# Patient Record
Sex: Female | Born: 1970 | Race: Black or African American | Hispanic: No | Marital: Married | Smoking: Former smoker
Health system: Southern US, Community
[De-identification: ages and names within clinical notes are randomized; demographics above are authoritative.]

## PROBLEM LIST (undated history)

## (undated) DIAGNOSIS — N809 Endometriosis, unspecified: Secondary | ICD-10-CM

## (undated) DIAGNOSIS — D649 Anemia, unspecified: Secondary | ICD-10-CM

## (undated) DIAGNOSIS — I10 Essential (primary) hypertension: Secondary | ICD-10-CM

## (undated) DIAGNOSIS — E559 Vitamin D deficiency, unspecified: Secondary | ICD-10-CM

## (undated) DIAGNOSIS — I5189 Other ill-defined heart diseases: Secondary | ICD-10-CM

## (undated) HISTORY — PX: CHOLECYSTECTOMY: SHX55

## (undated) HISTORY — PX: TUBAL LIGATION: SHX77

---

## 2010-12-19 ENCOUNTER — Emergency Department (HOSPITAL_COMMUNITY)
Admission: EM | Admit: 2010-12-19 | Discharge: 2010-12-19 | Disposition: A | Discharge status: Home / Self Care | Payer: BC Managed Care – HMO | Attending: Emergency Medicine | Admitting: Emergency Medicine

## 2010-12-19 DIAGNOSIS — N63 Unspecified lump in unspecified breast: Secondary | ICD-10-CM | POA: Insufficient documentation

## 2010-12-19 DIAGNOSIS — Z8742 Personal history of other diseases of the female genital tract: Secondary | ICD-10-CM | POA: Insufficient documentation

## 2010-12-19 DIAGNOSIS — N644 Mastodynia: Secondary | ICD-10-CM | POA: Insufficient documentation

## 2013-01-05 ENCOUNTER — Emergency Department (HOSPITAL_BASED_OUTPATIENT_CLINIC_OR_DEPARTMENT_OTHER): Payer: BC Managed Care – HMO

## 2013-01-05 ENCOUNTER — Emergency Department (HOSPITAL_BASED_OUTPATIENT_CLINIC_OR_DEPARTMENT_OTHER)
Admission: EM | Admit: 2013-01-05 | Discharge: 2013-01-05 | Disposition: A | Discharge status: Home / Self Care | Payer: BC Managed Care – HMO | Attending: Emergency Medicine | Admitting: Emergency Medicine

## 2013-01-05 ENCOUNTER — Encounter (HOSPITAL_BASED_OUTPATIENT_CLINIC_OR_DEPARTMENT_OTHER): Payer: Self-pay | Admitting: *Deleted

## 2013-01-05 DIAGNOSIS — Z8639 Personal history of other endocrine, nutritional and metabolic disease: Secondary | ICD-10-CM | POA: Insufficient documentation

## 2013-01-05 DIAGNOSIS — N390 Urinary tract infection, site not specified: Secondary | ICD-10-CM | POA: Insufficient documentation

## 2013-01-05 DIAGNOSIS — R209 Unspecified disturbances of skin sensation: Secondary | ICD-10-CM | POA: Insufficient documentation

## 2013-01-05 DIAGNOSIS — Z862 Personal history of diseases of the blood and blood-forming organs and certain disorders involving the immune mechanism: Secondary | ICD-10-CM | POA: Insufficient documentation

## 2013-01-05 DIAGNOSIS — R079 Chest pain, unspecified: Secondary | ICD-10-CM | POA: Insufficient documentation

## 2013-01-05 DIAGNOSIS — Z79899 Other long term (current) drug therapy: Secondary | ICD-10-CM | POA: Insufficient documentation

## 2013-01-05 DIAGNOSIS — R300 Dysuria: Secondary | ICD-10-CM | POA: Insufficient documentation

## 2013-01-05 DIAGNOSIS — I10 Essential (primary) hypertension: Secondary | ICD-10-CM | POA: Insufficient documentation

## 2013-01-05 DIAGNOSIS — R109 Unspecified abdominal pain: Secondary | ICD-10-CM | POA: Insufficient documentation

## 2013-01-05 DIAGNOSIS — M549 Dorsalgia, unspecified: Secondary | ICD-10-CM | POA: Insufficient documentation

## 2013-01-05 HISTORY — DX: Vitamin D deficiency, unspecified: E55.9

## 2013-01-05 HISTORY — DX: Essential (primary) hypertension: I10

## 2013-01-05 HISTORY — DX: Anemia, unspecified: D64.9

## 2013-01-05 LAB — CBC WITH DIFFERENTIAL/PLATELET
Basophils Relative: 0 % (ref 0–1)
Eosinophils Relative: 2 % (ref 0–5)
Hemoglobin: 9.4 g/dL — ABNORMAL LOW (ref 12.0–15.0)
Lymphocytes Relative: 30 % (ref 12–46)
MCH: 21.9 pg — ABNORMAL LOW (ref 26.0–34.0)
Monocytes Absolute: 0.6 10*3/uL (ref 0.1–1.0)
Monocytes Relative: 9 % (ref 3–12)
Neutrophils Relative %: 59 % (ref 43–77)
RBC: 4.3 MIL/uL (ref 3.87–5.11)
WBC: 6.8 10*3/uL (ref 4.0–10.5)

## 2013-01-05 LAB — COMPREHENSIVE METABOLIC PANEL
ALT: 15 U/L (ref 0–35)
AST: 17 U/L (ref 0–37)
Alkaline Phosphatase: 70 U/L (ref 39–117)
CO2: 26 mEq/L (ref 19–32)
Chloride: 101 mEq/L (ref 96–112)
GFR calc Af Amer: >90 mL/min (ref >90)
GFR calc non Af Amer: 89 mL/min — ABNORMAL LOW (ref >90)
Glucose, Bld: 109 mg/dL — ABNORMAL HIGH (ref 70–99)
Potassium: 3.6 mEq/L (ref 3.5–5.1)
Sodium: 137 mEq/L (ref 135–145)
Total Bilirubin: 0.2 mg/dL — ABNORMAL LOW (ref 0.3–1.2)

## 2013-01-05 LAB — URINE MICROSCOPIC-ADD ON

## 2013-01-05 LAB — URINALYSIS, ROUTINE W REFLEX MICROSCOPIC
Bilirubin Urine: NEGATIVE
Glucose, UA: NEGATIVE mg/dL
Hgb urine dipstick: NEGATIVE
Protein, ur: NEGATIVE mg/dL
Urobilinogen, UA: 1 mg/dL (ref 0.0–1.0)

## 2013-01-05 MED ORDER — CEPHALEXIN 500 MG PO CAPS: 500.0000 mg | ORAL_CAPSULE | Freq: Four times a day (QID) | ORAL | Status: DC

## 2013-01-05 MED ORDER — ASPIRIN 81 MG PO CHEW
324.0000 mg | CHEWABLE_TABLET | Freq: Once | ORAL | Status: AC
  Administered 2013-01-05: 324 mg via ORAL
  Filled 2013-01-05: qty 4

## 2013-01-05 MED ORDER — NITROGLYCERIN 0.4 MG SL SUBL
0.4000 mg | SUBLINGUAL_TABLET | SUBLINGUAL | Status: DC | PRN
  Filled 2013-01-05: qty 25

## 2013-01-05 NOTE — ED Provider Notes (Addendum)
Medical screening examination/treatment/procedure(s) were conducted as a shared visit with non-physician practitioner(s) and myself.  I personally evaluated the patient during the encounter  L sided chest pressure at rest with tingling in L arm. No nausea, some SOB. No previous cardiac history. Pain free now. EKG nsr.  No hypoxia or tachycardia to suggest PE.  Hx HTN, nonsmoker, no family history CAD. Heart score 2. Recommended observation for stress test which patient declined. She is agreeable to serial troponins.  Glynn Octave, MD 01/05/13 2009  Glynn Octave, MD 01/05/13 2026

## 2013-01-05 NOTE — ED Provider Notes (Signed)
   History    CSN: [id number] Arrival date & time 01/05/13  1413  First MD Initiated Contact with Patient 01/05/13 1433     Chief Complaint  Patient presents with  . Chest Pain   (Consider location/radiation/quality/duration/timing/severity/associated sxs/prior Treatment) Patient is a 42 y.o. female presenting with chest pain. The history is provided by the patient. No language interpreter was used.  Chest Pain Pain location:  L chest Pain quality: aching   Pain radiates to:  Upper back Pain radiates to the back: yes   Pain severity:  Moderate Onset quality:  Gradual Timing:  Constant Progression:  Worsening Chronicity:  New Context: breathing   Worsened by:  Movement Associated symptoms: abdominal pain   Associated symptoms: no cough   Pt reports she had chest pain before coming in pain lasted approx 30-45 minutes, no sweating, no nausea,   Pt had some tingling in left arm.   Pt also complains of back pain and feels like she has a urinary tract infection Past Medical History  Diagnosis Date  . Hypertension   . Anemia   . Vitamin D deficiency    Past Surgical History  Procedure Laterality Date  . Cholecystectomy    . Cesarean section     No family history on file. History  Substance Use Topics  . Smoking status: Never Smoker   . Smokeless tobacco: Not on file  . Alcohol Use: Yes   OB History   Grav Para Term Preterm Abortions TAB SAB Ect Mult Living                 Review of Systems  Respiratory: Negative for cough.   Cardiovascular: Positive for chest pain.  Gastrointestinal: Positive for abdominal pain.  Genitourinary: Positive for dysuria.  All other systems reviewed and are negative.    Allergies  Doxycycline  Home Medications   Current Outpatient Rx  Name  Route  Sig  Dispense  Refill  . amLODipine-benazepril (LOTREL) 5-10 MG per capsule   Oral   Take 1 capsule by mouth daily.          BP 135/76  Pulse 87  Temp(Src) 98.9 F (37.2 C)  (Oral)  Resp 20  Wt 210 lb (95.255 kg)  SpO2 99% Physical Exam  Constitutional: She appears well-developed and well-nourished.  HENT:  Head: Normocephalic.  Right Ear: External ear normal.  Left Ear: External ear normal.  Eyes: Conjunctivae are normal. Pupils are equal, round, and reactive to light.  Neck: Normal range of motion. Neck supple.  Cardiovascular: Normal rate.   Abdominal: Soft. Bowel sounds are normal. There is tenderness.  Musculoskeletal: Normal range of motion.  Neurological: She is alert.  Skin: Skin is warm.    ED Course  Procedures (including critical care time) Labs Reviewed  CBC WITH DIFFERENTIAL  COMPREHENSIVE METABOLIC PANEL  TROPONIN I   No results found. 1. UTI (lower urinary tract infection)   2. Chest pain     MDM   Date: 01/05/2013  Rate: 69  Rhythm: normal sinus rhythm  QRS Axis: normal  Intervals: normal  ST/T Wave abnormalities: normal  Conduction Disutrbances:none  Narrative Interpretation:   Old EKG Reviewed: none available Troponin negative x 2   Dr. Manus Gunning in to see,   EKG normal,   Pt counseled on results,   Pt given primary care referrals for followup.   RX for Keflex for uti  Elson Areas, [place] 01/05/13 1902

## 2013-01-05 NOTE — ED Notes (Signed)
Chest pain and her left arm feels strange. Sob and dizziness.

## 2013-01-07 LAB — URINE CULTURE

## 2013-01-08 ENCOUNTER — Telehealth (HOSPITAL_COMMUNITY): Payer: Self-pay | Admitting: Emergency Medicine

## 2013-01-08 NOTE — ED Notes (Signed)
Post ED Visit - Positive Culture Follow-up  Culture report reviewed by antimicrobial stewardship pharmacist: []  Wes Dulaney, Pharm.D., BCPS []  Celedonio Miyamoto, Pharm.D., BCPS [x]  Georgina Pillion, [address].D., BCPS []  [place], [place].D., BCPS, AAHIVP []  Estella Husk, Pharm.D., BCPS, AAHIVP  Positive urine culture Treated with Keflex, organism sensitive to the same and no further patient follow-up is required at this time.  Kylie A Holland 01/08/2013, 11:23 AM

## 2013-12-15 ENCOUNTER — Emergency Department (HOSPITAL_BASED_OUTPATIENT_CLINIC_OR_DEPARTMENT_OTHER): Payer: BC Managed Care – HMO

## 2013-12-15 ENCOUNTER — Encounter (HOSPITAL_BASED_OUTPATIENT_CLINIC_OR_DEPARTMENT_OTHER): Payer: Self-pay | Admitting: Emergency Medicine

## 2013-12-15 ENCOUNTER — Emergency Department (HOSPITAL_BASED_OUTPATIENT_CLINIC_OR_DEPARTMENT_OTHER)
Admission: EM | Admit: 2013-12-15 | Discharge: 2013-12-16 | Disposition: A | Discharge status: Home / Self Care | Payer: BC Managed Care – HMO | Attending: Emergency Medicine | Admitting: Emergency Medicine

## 2013-12-15 DIAGNOSIS — Z8742 Personal history of other diseases of the female genital tract: Secondary | ICD-10-CM | POA: Insufficient documentation

## 2013-12-15 DIAGNOSIS — Z792 Long term (current) use of antibiotics: Secondary | ICD-10-CM | POA: Insufficient documentation

## 2013-12-15 DIAGNOSIS — Z791 Long term (current) use of non-steroidal anti-inflammatories (NSAID): Secondary | ICD-10-CM | POA: Insufficient documentation

## 2013-12-15 DIAGNOSIS — Z862 Personal history of diseases of the blood and blood-forming organs and certain disorders involving the immune mechanism: Secondary | ICD-10-CM | POA: Insufficient documentation

## 2013-12-15 DIAGNOSIS — M62838 Other muscle spasm: Secondary | ICD-10-CM | POA: Insufficient documentation

## 2013-12-15 DIAGNOSIS — Z8639 Personal history of other endocrine, nutritional and metabolic disease: Secondary | ICD-10-CM | POA: Insufficient documentation

## 2013-12-15 DIAGNOSIS — I10 Essential (primary) hypertension: Secondary | ICD-10-CM | POA: Insufficient documentation

## 2013-12-15 HISTORY — DX: Endometriosis, unspecified: N80.9

## 2013-12-15 LAB — RAPID STREP SCREEN (MED CTR MEBANE ONLY): Streptococcus, Group A Screen (Direct): NEGATIVE

## 2013-12-15 MED ORDER — METHOCARBAMOL 500 MG PO TABS
1000.0000 mg | ORAL_TABLET | Freq: Once | ORAL | Status: AC
  Administered 2013-12-15: 1000 mg via ORAL
  Filled 2013-12-15: qty 2

## 2013-12-15 MED ORDER — OXYCODONE-ACETAMINOPHEN 5-325 MG PO TABS
2.0000 | ORAL_TABLET | Freq: Once | ORAL | Status: AC
  Administered 2013-12-15: 2 via ORAL
  Filled 2013-12-15: qty 2

## 2013-12-15 NOTE — ED Notes (Signed)
Pt reports sore throat, body aches and neck pain that started this am

## 2013-12-15 NOTE — ED Notes (Signed)
Pt c/o pain to back of neck increased w swallowing and some body aches onset this am and gradually getting worse

## 2013-12-15 NOTE — ED Provider Notes (Signed)
CSN: [id number]     Arrival date & time 12/15/13  2307 History  This chart was scribed for April Smitty CordsK Palumbo-Rasch, MD by Nicholos Johnsenise Iheanachor, ED scribe. This patient was seen in room MH05/MH05 and the patient's care was started at 11:36 PM.    Chief Complaint  Patient presents with  . Neck Pain  . Generalized Body Aches    Patient is a 43 y.o. female presenting with neck pain. The history is provided by the patient. No language interpreter was used.  Neck Pain Pain location:  Occipital region Quality:  Cramping Pain radiates to:  Does not radiate Pain severity:  Mild Pain is:  Same all the time Onset quality:  Gradual Timing:  Constant Progression:  Worsening Chronicity:  New Relieved by:  Nothing Worsened by:  Nothing tried Ineffective treatments:  Muscle relaxants Associated symptoms: no fever   Risk factors: no hx of head and neck radiation, no hx of spinal trauma and no recent epidural    HPI Comments: Paige Watson is a 43 y.o. female who presents to the Emergency Department complaining of gradually worsening throbbing neck pain; onset this morning. Over the past hour pain has radiated from the right side to the left. Initially thought it was stiffness from sleeping incorrectly. Pain the back of the neck with swallowing. States she cannot yawn because the pain is too severe. Took 500 mg Naproxen 1 hour ago. Does report getting a sew in weave 1 week ago. No new exercises. Sits at a computer for work. No new undergarments. Pt is left hand dominant. No recent travel. No new tick exposure   Past Medical History  Diagnosis Date  . Hypertension   . Anemia   . Vitamin D deficiency   . Endometriosis    Past Surgical History  Procedure Laterality Date  . Cholecystectomy    . Cesarean section     History reviewed. No pertinent family history. History  Substance Use Topics  . Smoking status: Never Smoker   . Smokeless tobacco: Not on file  . Alcohol Use: Yes   OB History   Grav Para Term Preterm Abortions TAB SAB Ect Mult Living                 Review of Systems  Constitutional: Negative for fever.  HENT: Negative for dental problem, drooling, sore throat, trouble swallowing and voice change.   Musculoskeletal: Positive for neck pain.  Skin: Negative for rash.  All other systems reviewed and are negative.   Allergies  Doxycycline  Home Medications   Prior to Admission medications   Medication Sig Start Date End Date Taking? Authorizing Provider  naproxen (NAPROSYN) 250 MG tablet Take by mouth 2 (two) times daily with a meal.   Yes Historical Provider, MD  amLODipine-benazepril (LOTREL) 5-10 MG per capsule Take 1 capsule by mouth daily.    Historical Provider, MD  cephALEXin (KEFLEX) 500 MG capsule Take 1 capsule (500 mg total) by mouth 4 (four) times daily. 01/05/13   Elson AreasLeslie K Sofia, PA-C   Triage vitals: BP 156/87  Pulse 85  Temp(Src) 98.2 F (36.8 C) (Oral)  Resp 16  Ht 5\' 5"  (1.651 m)  Wt 198 lb (89.812 kg)  BMI 32.95 kg/m2  SpO2 100%  LMP 11/14/2013 Physical Exam  Nursing note and vitals reviewed. Constitutional: She is oriented to person, place, and time. She appears well-developed and well-nourished. No distress.  HENT:  Head: Normocephalic and atraumatic.  Right Ear: Tympanic membrane and external  ear normal.  Left Ear: Tympanic membrane and external ear normal.  Mouth/Throat: Oropharynx is clear and moist. No oropharyngeal exudate.  Mallampati class 1. Mastoids normal. Uvula midline and normal.  Eyes: Conjunctivae and EOM are normal. Pupils are equal, round, and reactive to light.  Neck: Trachea normal and normal range of motion. Neck supple. Carotid bruit is not present. No tracheal deviation present.  No meningismus. Palpable spasm in trapezius. Right greater than left. Neck is pulled slightly to the right.   Cardiovascular: Normal rate, regular rhythm and normal heart sounds.   Pules 74. Regular. No ectopy.   Pulmonary/Chest:  Effort normal and breath sounds normal. No stridor. She has no wheezes. She has no rales.  Abdominal: Soft. Bowel sounds are normal. She exhibits no distension. There is no tenderness. There is no rebound and no guarding.  Musculoskeletal: Normal range of motion.  Lymphadenopathy:    She has no cervical adenopathy.  Neurological: She is alert and oriented to person, place, and time. She has normal reflexes.  Skin: Skin is warm and dry. No rash noted. No erythema.  Psychiatric: She has a normal mood and affect. Judgment normal.    ED Course  Procedures (including critical care time) DIAGNOSTIC STUDIES: Oxygen Saturation is 100% on room air, normal by my interpretation.    COORDINATION OF CARE: At 11:41 PM: Discussed treatment plan with patient which includes pain medication and muscle relaxer. Patient agrees.    Labs Review Labs Reviewed  RAPID STREP SCREEN    Imaging Review No results found.   EKG Interpretation None      MDM   Final diagnoses:  None   Neck spasm, marked improvement post medication.  No signs of bony or infectious causes.  Take medication as directed.  Follow up with your family doctor in 2 days for recheck.  Return for fevers, rashes on the skin inability to swallow or any concerns.    I personally performed the services described in this documentation, which was scribed in my presence. The recorded information has been reviewed and is accurate.     Jasmine AweApril K Palumbo-Rasch, MD 12/16/13 [id number]

## 2013-12-16 ENCOUNTER — Encounter (HOSPITAL_BASED_OUTPATIENT_CLINIC_OR_DEPARTMENT_OTHER): Payer: Self-pay | Admitting: Emergency Medicine

## 2013-12-16 MED ORDER — NAPROXEN 500 MG PO TABS: 500.0000 mg | ORAL_TABLET | Freq: Two times a day (BID) | ORAL | Status: DC

## 2013-12-16 MED ORDER — OXYCODONE-ACETAMINOPHEN 5-325 MG PO TABS: 1.0000 | ORAL_TABLET | Freq: Four times a day (QID) | ORAL | Status: DC | PRN

## 2013-12-16 MED ORDER — METHOCARBAMOL 500 MG PO TABS: 500.0000 mg | ORAL_TABLET | Freq: Three times a day (TID) | ORAL | Status: DC

## 2013-12-16 NOTE — Discharge Instructions (Signed)

## 2013-12-18 LAB — CULTURE, GROUP A STREP

## 2014-02-14 ENCOUNTER — Other Ambulatory Visit (HOSPITAL_COMMUNITY): Payer: Self-pay

## 2014-02-15 ENCOUNTER — Encounter (HOSPITAL_COMMUNITY)
Admission: RE | Admit: 2014-02-15 | Discharge: 2014-02-15 | Disposition: A | Discharge status: Home / Self Care | Payer: BC Managed Care – HMO | Source: Ambulatory Visit | Attending: Obstetrics and Gynecology | Admitting: Obstetrics and Gynecology

## 2014-02-15 DIAGNOSIS — D649 Anemia, unspecified: Secondary | ICD-10-CM | POA: Insufficient documentation

## 2014-02-15 MED ORDER — SODIUM CHLORIDE 0.9 % IV SOLN
510.0000 mg | Freq: Once | INTRAVENOUS | Status: AC
  Administered 2014-02-15: 510 mg via INTRAVENOUS
  Filled 2014-02-15: qty 17

## 2014-02-15 NOTE — Discharge Instructions (Signed)

## 2014-02-19 ENCOUNTER — Other Ambulatory Visit (HOSPITAL_COMMUNITY): Payer: Self-pay | Admitting: *Deleted

## 2014-02-20 ENCOUNTER — Encounter (HOSPITAL_COMMUNITY)
Admission: RE | Admit: 2014-02-20 | Discharge: 2014-02-20 | Disposition: A | Discharge status: Home / Self Care | Payer: BC Managed Care – HMO | Source: Ambulatory Visit | Attending: Obstetrics and Gynecology | Admitting: Obstetrics and Gynecology

## 2014-02-20 DIAGNOSIS — D649 Anemia, unspecified: Secondary | ICD-10-CM | POA: Diagnosis present

## 2014-02-20 MED ORDER — SODIUM CHLORIDE 0.9 % IV SOLN
510.0000 mg | Freq: Once | INTRAVENOUS | Status: AC
  Administered 2014-02-20: 510 mg via INTRAVENOUS
  Filled 2014-02-20: qty 17

## 2015-04-21 ENCOUNTER — Emergency Department (HOSPITAL_BASED_OUTPATIENT_CLINIC_OR_DEPARTMENT_OTHER)
Admission: EM | Admit: 2015-04-21 | Discharge: 2015-04-21 | Disposition: A | Discharge status: Home / Self Care | Payer: BLUE CROSS/BLUE SHIELD | Attending: Emergency Medicine | Admitting: Emergency Medicine

## 2015-04-21 ENCOUNTER — Encounter (HOSPITAL_BASED_OUTPATIENT_CLINIC_OR_DEPARTMENT_OTHER): Payer: Self-pay | Admitting: *Deleted

## 2015-04-21 DIAGNOSIS — Z791 Long term (current) use of non-steroidal anti-inflammatories (NSAID): Secondary | ICD-10-CM | POA: Insufficient documentation

## 2015-04-21 DIAGNOSIS — Z8639 Personal history of other endocrine, nutritional and metabolic disease: Secondary | ICD-10-CM | POA: Diagnosis not present

## 2015-04-21 DIAGNOSIS — Z79899 Other long term (current) drug therapy: Secondary | ICD-10-CM | POA: Diagnosis not present

## 2015-04-21 DIAGNOSIS — Z792 Long term (current) use of antibiotics: Secondary | ICD-10-CM | POA: Diagnosis not present

## 2015-04-21 DIAGNOSIS — I10 Essential (primary) hypertension: Secondary | ICD-10-CM | POA: Diagnosis not present

## 2015-04-21 DIAGNOSIS — Z8742 Personal history of other diseases of the female genital tract: Secondary | ICD-10-CM | POA: Insufficient documentation

## 2015-04-21 DIAGNOSIS — R590 Localized enlarged lymph nodes: Secondary | ICD-10-CM | POA: Diagnosis not present

## 2015-04-21 DIAGNOSIS — M542 Cervicalgia: Secondary | ICD-10-CM | POA: Diagnosis present

## 2015-04-21 DIAGNOSIS — Z862 Personal history of diseases of the blood and blood-forming organs and certain disorders involving the immune mechanism: Secondary | ICD-10-CM | POA: Insufficient documentation

## 2015-04-21 DIAGNOSIS — M25512 Pain in left shoulder: Secondary | ICD-10-CM

## 2015-04-21 MED ORDER — NAPROXEN 500 MG PO TABS: 500.0000 mg | ORAL_TABLET | Freq: Two times a day (BID) | ORAL | Status: DC

## 2015-04-21 MED ORDER — CYCLOBENZAPRINE HCL 10 MG PO TABS: 10.0000 mg | ORAL_TABLET | Freq: Three times a day (TID) | ORAL | Status: AC | PRN

## 2015-04-21 NOTE — ED Provider Notes (Signed)
CSN: [id number]     Arrival date & time 04/21/15  2015 History  By signing my name below, I, Ronney Lion, attest that this documentation has been prepared under the direction and in the presence of Geoffery Lyons, MD. Electronically Signed: Ronney Lion, ED Scribe. 04/21/2015. 11:17 PM.    Chief Complaint  Patient presents with  . Neck Pain   The history is provided by the patient. No language interpreter was used.    HPI Comments: Paige Watson is a 44 y.o. female with a history of HTN, anemia, and Vitamin D deficiency, who presents to the Emergency Department complaining of gradual-onset, intermittent, muscle-spasm-like pain near her left shoulder blade radiating up her posterior neck that began earlier today. She also reports feeling a "knot" in her neck. Swallowing increases her pain. She reports she had similar symptoms in the past last year; it had progressively worsened at that time to a severe degree, so she came in today to prevent it from progressing the same way. Patient was treated with muscle relaxants and pain medications during her episode last year. She states she had had a hair weave 1 week before her symptoms onset last year, which was suspected to be the cause of her symptoms. She denies any sore throat or trouble swallowing.  Past Medical History  Diagnosis Date  . Hypertension   . Anemia   . Vitamin D deficiency   . Endometriosis    Past Surgical History  Procedure Laterality Date  . Cholecystectomy    . Cesarean section    . Tubal ligation     No family history on file. Social History  Substance Use Topics  . Smoking status: Never Smoker   . Smokeless tobacco: Never Used  . Alcohol Use: Yes     Comment: wine occasional   OB History    No data available     Review of Systems  HENT: Negative for sore throat and trouble swallowing.   Musculoskeletal: Positive for neck pain.  All other systems reviewed and are negative.   Allergies  Doxycycline  Home  Medications   Prior to Admission medications   Medication Sig Start Date End Date Taking? Authorizing Provider  amLODipine-benazepril (LOTREL) 5-10 MG per capsule Take 1 capsule by mouth daily.    Historical Provider, MD  cephALEXin (KEFLEX) 500 MG capsule Take 1 capsule (500 mg total) by mouth 4 (four) times daily. 01/05/13   Elson Areas, PA-C  methocarbamol (ROBAXIN) 500 MG tablet Take 1 tablet (500 mg total) by mouth 3 (three) times daily. 12/16/13   April Palumbo, MD  naproxen (NAPROSYN) 250 MG tablet Take by mouth 2 (two) times daily with a meal.    Historical Provider, MD  naproxen (NAPROSYN) 500 MG tablet Take 1 tablet (500 mg total) by mouth 2 (two) times daily with a meal. 12/16/13   April Palumbo, MD  oxyCODONE-acetaminophen (PERCOCET) 5-325 MG per tablet Take 1 tablet by mouth every 6 (six) hours as needed. 12/16/13   April Palumbo, MD   BP 158/97 mmHg  Pulse 78  Temp(Src) 98.7 F (37.1 C) (Oral)  Resp 18  Ht  (1.651 m)  Wt 201 lb (91.173 kg)  BMI 33.45 kg/m2  SpO2 100%  LMP 04/02/2015 (Within Days) Physical Exam  Constitutional: She is oriented to person, place, and time. She appears well-developed and well-nourished. No distress.  HENT:  Head: Normocephalic and atraumatic.  Eyes: Conjunctivae and EOM are normal.  Neck: Normal range of motion.  Neck supple. No tracheal deviation present.  There is 1 small palpable submandibular lymph node.  Cardiovascular: Normal rate.   Pulmonary/Chest: Effort normal. No respiratory distress.  Musculoskeletal: Normal range of motion.  FROM of neck and left shoulder. The LUE is NVI.   Neurological: She is alert and oriented to person, place, and time.  Skin: Skin is warm and dry.  Psychiatric: She has a normal mood and affect. Her behavior is normal.  Nursing note and vitals reviewed.   ED Course  Procedures (including critical care time)  DIAGNOSTIC STUDIES: Oxygen Saturation is 100% on RA, normal by my interpretation.     COORDINATION OF CARE: 11:09 PM - Suspect musculoskeletal pain in etiology. Discussed treatment plan with pt at bedside which includes monitoring for persistent or worsening symptoms for 1 week, and return if symptoms persist. Will Rx anti-inflammatories and muscle relaxants. Pt verbalized understanding and agreed to plan.   MDM   Final diagnoses:  None    Patient presents with complaints of left neck swelling and pain in her left posterior shoulder. This began in the accident of any injury or trauma yesterday. I am able to palpate a small lymph node in the submandibular region which is nontender. Her shoulder exam is unremarkable. She reports a similar episode one year ago which was treated with anti-inflammatory and muscle relaxers. She is requesting these now. I feel as though this is reasonable and do not feel as though any workup is indicated at this time. She will be discharged, to return as needed for any problems. I have considered but highly doubt any cardiac or pulmonary etiology.   I personally performed the services described in this documentation, which was scribed in my presence. The recorded information has been reviewed and is accurate.        Geoffery Lyonsouglas Ellarae Nevitt, MD 04/22/15 [phone]

## 2015-04-21 NOTE — Discharge Instructions (Signed)
Naproxen as prescribed.  Flexeril as prescribed as needed for pain not relieved with naproxen.  Follow-up with your primary Dr. if not improving in the next week.   Shoulder Pain The shoulder is the joint that connects your arms to your body. The bones that form the shoulder joint include the upper arm bone (humerus), the shoulder blade (scapula), and the collarbone (clavicle). The top of the humerus is shaped like a ball and fits into a rather flat socket on the scapula (glenoid cavity). A combination of muscles and strong, fibrous tissues that connect muscles to bones (tendons) support your shoulder joint and hold the ball in the socket. Small, fluid-filled sacs (bursae) are located in different areas of the joint. They act as cushions between the bones and the overlying soft tissues and help reduce friction between the gliding tendons and the bone as you move your arm. Your shoulder joint allows a wide range of motion in your arm. This range of motion allows you to do things like scratch your back or throw a ball. However, this range of motion also makes your shoulder more prone to pain from overuse and injury. Causes of shoulder pain can originate from both injury and overuse and usually can be grouped in the following four categories:  Redness, swelling, and pain (inflammation) of the tendon (tendinitis) or the bursae (bursitis).  Instability, such as a dislocation of the joint.  Inflammation of the joint (arthritis).  Broken bone (fracture). HOME CARE INSTRUCTIONS   Apply ice to the sore area.  Put ice in a plastic bag.  Place a towel between your skin and the bag.  Leave the ice on for 15-20 minutes, 3-4 times per day for the first 2 days, or as directed by your health care provider.  Stop using cold packs if they do not help with the pain.  If you have a shoulder sling or immobilizer, wear it as long as your caregiver instructs. Only remove it to shower or bathe. Move your arm  as little as possible, but keep your hand moving to prevent swelling.  Squeeze a soft ball or foam pad as much as possible to help prevent swelling.  Only take over-the-counter or prescription medicines for pain, discomfort, or fever as directed by your caregiver. SEEK MEDICAL CARE IF:   Your shoulder pain increases, or new pain develops in your arm, hand, or fingers.  Your hand or fingers become cold and numb.  Your pain is not relieved with medicines. SEEK IMMEDIATE MEDICAL CARE IF:   Your arm, hand, or fingers are numb or tingling.  Your arm, hand, or fingers are significantly swollen or turn white or blue. MAKE SURE YOU:   Understand these instructions.  Will watch your condition.  Will get help right away if you are not doing well or get worse.   This information is not intended to replace advice given to you by your health care provider. Make sure you discuss any questions you have with your health care provider.   Document Released: 03/18/2005 Document Revised: 06/29/2014 Document Reviewed: 10/01/2014 Elsevier Interactive Patient Education [employer]2016 [employer]

## 2015-04-21 NOTE — ED Notes (Signed)
Pt reports when she swallows she feels "pinchy" pain that radiates into her neck- also states she a "kernel" under her jaw

## 2018-03-10 ENCOUNTER — Encounter: Payer: BLUE CROSS/BLUE SHIELD | Admitting: Obstetrics and Gynecology

## 2019-02-01 ENCOUNTER — Other Ambulatory Visit: Payer: Self-pay

## 2019-02-01 DIAGNOSIS — Z20822 Contact with and (suspected) exposure to covid-19: Secondary | ICD-10-CM

## 2019-02-02 LAB — NOVEL CORONAVIRUS, NAA: SARS-CoV-2, NAA: NOT DETECTED

## 2019-05-20 ENCOUNTER — Emergency Department (HOSPITAL_BASED_OUTPATIENT_CLINIC_OR_DEPARTMENT_OTHER)
Admission: EM | Admit: 2019-05-20 | Discharge: 2019-05-20 | Disposition: A | Discharge status: Home / Self Care | Payer: BLUE CROSS/BLUE SHIELD | Attending: Emergency Medicine | Admitting: Emergency Medicine

## 2019-05-20 ENCOUNTER — Emergency Department (HOSPITAL_BASED_OUTPATIENT_CLINIC_OR_DEPARTMENT_OTHER): Payer: BLUE CROSS/BLUE SHIELD

## 2019-05-20 ENCOUNTER — Encounter (HOSPITAL_BASED_OUTPATIENT_CLINIC_OR_DEPARTMENT_OTHER): Payer: Self-pay | Admitting: Emergency Medicine

## 2019-05-20 ENCOUNTER — Other Ambulatory Visit: Payer: Self-pay

## 2019-05-20 DIAGNOSIS — Z79899 Other long term (current) drug therapy: Secondary | ICD-10-CM | POA: Diagnosis not present

## 2019-05-20 DIAGNOSIS — Z20822 Contact with and (suspected) exposure to covid-19: Secondary | ICD-10-CM

## 2019-05-20 DIAGNOSIS — I10 Essential (primary) hypertension: Secondary | ICD-10-CM | POA: Insufficient documentation

## 2019-05-20 DIAGNOSIS — R079 Chest pain, unspecified: Secondary | ICD-10-CM | POA: Diagnosis not present

## 2019-05-20 DIAGNOSIS — Z20828 Contact with and (suspected) exposure to other viral communicable diseases: Secondary | ICD-10-CM | POA: Diagnosis present

## 2019-05-20 LAB — SARS CORONAVIRUS 2 AG (30 MIN TAT): SARS Coronavirus 2 Ag: NEGATIVE

## 2019-05-20 NOTE — ED Provider Notes (Signed)
[facility] EMERGENCY DEPARTMENT Provider Note   CSN: [id number] Arrival date & time: 05/20/19  1812     History   Chief Complaint Chief Complaint  Patient presents with  . wants covid test    HPI Paige Watson is a 48 y.o. female with history of hypertension, endometriosis, grade 2 diastolic dysfunction who presents after COVID-19 exposure to her husband for COVID-19 test.  Patient reports she has noticed her chest being tighter and more sore than normal, as she does have some chest sensations at baseline related to her diastolic dysfunction.  She denies any shortness of breath, fever, cough, or other symptoms.     HPI  Past Medical History:  Diagnosis Date  . Anemia   . Endometriosis   . Hypertension   . Vitamin D deficiency     There are no active problems to display for this patient.   Past Surgical History:  Procedure Laterality Date  . CESAREAN SECTION    . CHOLECYSTECTOMY    . TUBAL LIGATION       OB History   No obstetric history on file.      Home Medications    Prior to Admission medications   Medication Sig Start Date End Date Taking? Authorizing Provider  amLODipine-benazepril (LOTREL) 5-10 MG per capsule Take 1 capsule by mouth daily.    [provider]  cephALEXin (KEFLEX) 500 MG capsule Take 1 capsule (500 mg total) by mouth 4 (four) times daily. 01/05/13   Elson AreasSofia, Leslie K, PA-C  cyclobenzaprine (FLEXERIL) 10 MG tablet Take 1 tablet (10 mg total) by mouth 3 (three) times daily as needed for muscle spasms. 04/21/15   Geoffery Lyonselo, Douglas, MD  methocarbamol (ROBAXIN) 500 MG tablet Take 1 tablet (500 mg total) by mouth 3 (three) times daily. 12/16/13   Palumbo, April, MD  naproxen (NAPROSYN) 500 MG tablet Take 1 tablet (500 mg total) by mouth 2 (two) times daily. 04/21/15   Geoffery Lyonselo, Douglas, MD  oxyCODONE-acetaminophen (PERCOCET) 5-325 MG per tablet Take 1 tablet by mouth every 6 (six) hours as needed. 12/16/13   Palumbo, April, MD     Family History No family history on file.  Social History Social History   Tobacco Use  . Smoking status: Never Smoker  . Smokeless tobacco: Never Used  Substance Use Topics  . Alcohol use: Yes    Comment: wine occasional  . Drug use: No     Allergies   Doxycycline   Review of Systems Review of Systems  Constitutional: Negative for chills and fever.  HENT: Negative for facial swelling and sore throat.   Respiratory: Positive for chest tightness. Negative for cough and shortness of breath.   Cardiovascular: Positive for chest pain.  Gastrointestinal: Negative for abdominal pain, nausea and vomiting.  Genitourinary: Negative for dysuria.  Musculoskeletal: Negative for back pain.  Skin: Negative for rash and wound.  Neurological: Negative for headaches.  Psychiatric/Behavioral: The patient is not nervous/anxious.      Physical Exam Updated Vital Signs BP (!) 149/100 (BP Location: Right Arm)   Pulse 65   Temp 98.3 F (36.8 C) (Oral)   Resp 16   Ht 5\' 5"  (1.651 m)   Wt 99.8 kg   SpO2 99%   BMI 36.61 kg/m   Physical Exam Vitals signs and nursing note reviewed.  Constitutional:      General: She is not in acute distress.    Appearance: She is well-developed. She is not diaphoretic.  HENT:  Head: Normocephalic and atraumatic.     Mouth/Throat:     Pharynx: No oropharyngeal exudate.  Eyes:     General: No scleral icterus.       Right eye: No discharge.        Left eye: No discharge.     Conjunctiva/sclera: Conjunctivae normal.     Pupils: Pupils are equal, round, and reactive to light.  Neck:     Musculoskeletal: Normal range of motion and neck supple.     Thyroid: No thyromegaly.  Cardiovascular:     Rate and Rhythm: Normal rate and regular rhythm.     Heart sounds: Normal heart sounds. No murmur. No friction rub. No gallop.   Pulmonary:     Effort: Pulmonary effort is normal. No respiratory distress.     Breath sounds: Normal breath sounds. No  stridor. No wheezing or rales.  Abdominal:     General: Bowel sounds are normal. There is no distension.     Palpations: Abdomen is soft.     Tenderness: There is no abdominal tenderness. There is no guarding or rebound.  Lymphadenopathy:     Cervical: No cervical adenopathy.  Skin:    General: Skin is warm and dry.     Coloration: Skin is not pale.     Findings: No rash.  Neurological:     Mental Status: She is alert.     Coordination: Coordination normal.      ED Treatments / Results  Labs (all labs ordered are listed, but only abnormal results are displayed) Labs Reviewed  SARS CORONAVIRUS 2 AG (30 MIN TAT)  NOVEL CORONAVIRUS, NAA (HOSP ORDER, SEND-OUT TO REF LAB; TAT 18-24 HRS)    EKG EKG Interpretation  Date/Time:  Saturday May 20 2019 19:12:05 EST Ventricular Rate:  70 PR Interval:    QRS Duration: 96 QT Interval:  409 QTC Calculation: 442 R Axis:   58 Text Interpretation: Sinus rhythm Borderline T wave abnormalities since last tracing no significant change Confirmed by Rolan Bucco [phone]) on 05/20/2019 8:15:18 PM   Radiology Dg Chest Portable 1 View  Result Date: 05/20/2019 CLINICAL DATA:  Chest tightness and soreness, history of diastolic dysfunction, COVID-19 exposure EXAM: PORTABLE CHEST 1 VIEW COMPARISON:  Radiograph 01/05/2013 FINDINGS: No consolidation, features of edema, pneumothorax, or effusion. Pulmonary vascularity is normally distributed. The cardiomediastinal contours are unremarkable. No acute osseous or soft tissue abnormality. IMPRESSION: No acute cardiopulmonary abnormality. Electronically Signed   By: Kreg Shropshire M.D.   On: 05/20/2019 19:52    Procedures Procedures (including critical care time)  Medications Ordered in ED Medications - No data to display   Initial Impression / Assessment and Plan / ED Course  I have reviewed the triage vital signs and the nursing notes.  Pertinent labs & imaging results that were available  during my care of the patient were reviewed by me and considered in my medical decision making (see chart for details).        Patient presenting for COVID-19 test after her husband tested positive.  She has not really had any symptoms, however does note she had some chest pain a little worse than her baseline 5 days ago, which is now resolved mostly.  She reports a little bit of tightness and soreness.  Chest x-ray is clear.  EKG is unchanged from previous.  Low suspicion of ACS at this time.  Rapid Covid is negative, but send out confirmatory test pending.  Isolation still discussed considering positive contact.  Follow-up  to PCP or cardiologist if chest pain continues to be worse than baseline.  Return precautions discussed.  Patient understands and agrees with plan.  Patient vitals stable throughout ED course and discharged in satisfactory condition. I discussed patient case with Dr. Tamera Punt who guided the patient's management and agrees with plan.   Final Clinical Impressions(s) / ED Diagnoses   Final diagnoses:  Close exposure to COVID-19 virus    ED Discharge Orders    None       Frederica Kuster, PA-C 05/20/19 2246    Malvin Johns, MD 05/20/19 [phone]

## 2019-05-20 NOTE — Discharge Instructions (Signed)
You will be called if you test positive for COVID-19.  If not, you can look up the results on MyChart.  Please follow-up with your doctor if your chest pain is continuing and unlike your typical baseline chest pain.  Please return to the emergency department if you develop any new or worsening symptoms.

## 2019-05-20 NOTE — ED Triage Notes (Signed)
Patient here with spouse who is positive for covid.  Wants a test.  Denies having any symptoms.

## 2019-05-22 LAB — NOVEL CORONAVIRUS, NAA (HOSP ORDER, SEND-OUT TO REF LAB; TAT 18-24 HRS): SARS-CoV-2, NAA: NOT DETECTED

## 2019-09-02 ENCOUNTER — Ambulatory Visit: Payer: BLUE CROSS/BLUE SHIELD | Attending: Internal Medicine

## 2019-09-02 DIAGNOSIS — Z23 Encounter for immunization: Secondary | ICD-10-CM

## 2019-09-02 NOTE — Progress Notes (Signed)
   Covid-19 Vaccination Clinic  Name:  AUDREENA SACHDEVA    MRN: [id number] DOB: 1970/10/28  09/02/2019  Ms. Surratt was observed post Covid-19 immunization for 15 minutes without incident. She was provided with Vaccine Information Sheet and instruction to access the V-Safe system.   Ms. Moger was instructed to call 911 with any severe reactions post vaccine: Marland Kitchen Difficulty breathing  . Swelling of face and throat  . A fast heartbeat  . A bad rash all over body  . Dizziness and weakness   Immunizations Administered    Name Date Dose VIS Date Route   Pfizer COVID-19 Vaccine 09/02/2019  8:30 AM 0.3 mL 06/02/2019 Intramuscular   Manufacturer: [employer], [employer]   Lot: [id number]   NDC: [address]-0

## 2019-09-27 ENCOUNTER — Ambulatory Visit: Payer: BLUE CROSS/BLUE SHIELD | Attending: Internal Medicine

## 2019-09-27 DIAGNOSIS — Z23 Encounter for immunization: Secondary | ICD-10-CM

## 2019-09-27 NOTE — Progress Notes (Signed)
   Covid-19 Vaccination Clinic  Name:  Paige Watson    MRN: [id number] DOB: May 04, 1971  09/27/2019  [name] was observed post Covid-19 immunization for 15 minutes without incident. She was provided with Vaccine Information Sheet and instruction to access the V-Safe system.   [name] was instructed to call 911 with any severe reactions post vaccine: Marland Kitchen Difficulty breathing  . Swelling of face and throat  . A fast heartbeat  . A bad rash all over body  . Dizziness and weakness   Immunizations Administered    Name Date Dose VIS Date Route   Pfizer COVID-19 Vaccine 09/27/2019  4:55 PM 0.3 mL 06/02/2019 Intramuscular   Manufacturer: [employer], [employer]   Lot: [id number]   NDC: [address]-5

## 2019-12-26 ENCOUNTER — Other Ambulatory Visit: Payer: Self-pay

## 2019-12-26 ENCOUNTER — Emergency Department (HOSPITAL_BASED_OUTPATIENT_CLINIC_OR_DEPARTMENT_OTHER): Payer: BC Managed Care – PPO

## 2019-12-26 ENCOUNTER — Encounter (HOSPITAL_BASED_OUTPATIENT_CLINIC_OR_DEPARTMENT_OTHER): Payer: Self-pay

## 2019-12-26 ENCOUNTER — Emergency Department (HOSPITAL_BASED_OUTPATIENT_CLINIC_OR_DEPARTMENT_OTHER)
Admission: EM | Admit: 2019-12-26 | Discharge: 2019-12-27 | Disposition: A | Discharge status: Home / Self Care | Payer: BC Managed Care – PPO | Attending: Emergency Medicine | Admitting: Emergency Medicine

## 2019-12-26 DIAGNOSIS — R519 Headache, unspecified: Secondary | ICD-10-CM | POA: Insufficient documentation

## 2019-12-26 DIAGNOSIS — Z79899 Other long term (current) drug therapy: Secondary | ICD-10-CM | POA: Insufficient documentation

## 2019-12-26 DIAGNOSIS — I10 Essential (primary) hypertension: Secondary | ICD-10-CM | POA: Insufficient documentation

## 2019-12-26 DIAGNOSIS — R0602 Shortness of breath: Secondary | ICD-10-CM | POA: Insufficient documentation

## 2019-12-26 DIAGNOSIS — R5383 Other fatigue: Secondary | ICD-10-CM | POA: Insufficient documentation

## 2019-12-26 HISTORY — DX: Other ill-defined heart diseases: I51.89

## 2019-12-26 LAB — BASIC METABOLIC PANEL
Anion gap: 9 (ref 5–15)
BUN: 8 mg/dL (ref 6–20)
CO2: 26 mmol/L (ref 22–32)
Calcium: 8.6 mg/dL — ABNORMAL LOW (ref 8.9–10.3)
Chloride: 104 mmol/L (ref 98–111)
Creatinine, Ser: 0.71 mg/dL (ref 0.44–1.00)
GFR calc Af Amer: >60 mL/min (ref >60)
GFR calc non Af Amer: >60 mL/min (ref >60)
Glucose, Bld: 105 mg/dL — ABNORMAL HIGH (ref 70–99)
Potassium: 3.7 mmol/L (ref 3.5–5.1)
Sodium: 139 mmol/L (ref 135–145)

## 2019-12-26 LAB — CBC WITH DIFFERENTIAL/PLATELET
Abs Immature Granulocytes: 0.01 10*3/uL (ref 0.00–0.07)
Basophils Absolute: 0 10*3/uL (ref 0.0–0.1)
Basophils Relative: 0 %
Eosinophils Absolute: 0.1 10*3/uL (ref 0.0–0.5)
Eosinophils Relative: 2 %
HCT: 41.5 % (ref 36.0–46.0)
Hemoglobin: 13.8 g/dL (ref 12.0–15.0)
Immature Granulocytes: 0 %
Lymphocytes Relative: 41 %
Lymphs Abs: 2.1 10*3/uL (ref 0.7–4.0)
MCH: 30.8 pg (ref 26.0–34.0)
MCHC: 33.3 g/dL (ref 30.0–36.0)
MCV: 92.6 fL (ref 80.0–100.0)
Monocytes Absolute: 0.5 10*3/uL (ref 0.1–1.0)
Monocytes Relative: 9 %
Neutro Abs: 2.5 10*3/uL (ref 1.7–7.7)
Neutrophils Relative %: 48 %
Platelets: 229 10*3/uL (ref 150–400)
RBC: 4.48 MIL/uL (ref 3.87–5.11)
RDW: 12.4 % (ref 11.5–15.5)
WBC: 5.1 10*3/uL (ref 4.0–10.5)
nRBC: 0 % (ref 0.0–0.2)

## 2019-12-26 MED ORDER — SODIUM CHLORIDE 0.9 % IV SOLN: INTRAVENOUS | Status: DC

## 2019-12-26 MED ORDER — DEXAMETHASONE SODIUM PHOSPHATE 10 MG/ML IJ SOLN
10.0000 mg | Freq: Once | INTRAMUSCULAR | Status: AC
  Administered 2019-12-26: 10 mg via INTRAVENOUS
  Filled 2019-12-26: qty 1

## 2019-12-26 MED ORDER — DIPHENHYDRAMINE HCL 50 MG/ML IJ SOLN
25.0000 mg | Freq: Once | INTRAMUSCULAR | Status: AC
  Administered 2019-12-26: 25 mg via INTRAVENOUS
  Filled 2019-12-26: qty 1

## 2019-12-26 MED ORDER — SODIUM CHLORIDE 0.9 % IV BOLUS
1000.0000 mL | Freq: Once | INTRAVENOUS | Status: AC
  Administered 2019-12-26: 1000 mL via INTRAVENOUS

## 2019-12-26 MED ORDER — HYDROCHLOROTHIAZIDE 25 MG PO TABS
25.0000 mg | ORAL_TABLET | Freq: Once | ORAL | Status: AC
  Administered 2019-12-26: 25 mg via ORAL
  Filled 2019-12-26: qty 1

## 2019-12-26 MED ORDER — ENALAPRIL MALEATE 5 MG PO TABS
10.0000 mg | ORAL_TABLET | Freq: Once | ORAL | Status: AC
  Administered 2019-12-26: 10 mg via ORAL
  Filled 2019-12-26: qty 2

## 2019-12-26 MED ORDER — METOCLOPRAMIDE HCL 5 MG/ML IJ SOLN
10.0000 mg | Freq: Once | INTRAMUSCULAR | Status: AC
  Administered 2019-12-26: 10 mg via INTRAVENOUS
  Filled 2019-12-26: qty 2

## 2019-12-26 NOTE — ED Notes (Signed)
Pt states she is out of BP meds x 1 month

## 2019-12-26 NOTE — ED Notes (Signed)
Reg advised pt c/o CP-EMT asked to perform EKG

## 2019-12-26 NOTE — ED Notes (Signed)
Patient transported to CT 

## 2019-12-26 NOTE — ED Notes (Signed)
ED Provider at bedside. 

## 2019-12-26 NOTE — ED Triage Notes (Addendum)
Pt c/o HA, nausea, fatigue, SOB, pain to ears and teeth since 12pm-denies fever/cough-NAD-steady gait

## 2019-12-27 MED ORDER — ENALAPRIL-HYDROCHLOROTHIAZIDE 10-25 MG PO TABS: 1.0000 | ORAL_TABLET | Freq: Every day | ORAL | 1 refills | Status: DC

## 2019-12-27 NOTE — ED Provider Notes (Signed)
[facility] EMERGENCY DEPARTMENT Provider Note   CSN: [id number] Arrival date & time: 12/26/19  1844     History Chief Complaint  Patient presents with  . Headache    Paige Watson is a 49 y.o. female.  Patient with a history of hypertension.  But has been out of her blood pressure medicines for about a month.  Patient's also been having difficulty with headaches like she is having tonight over the past 6 months on and off.  No prior history of migraines.  Patient states that the headache is kind of anterior and then to the 1 side.  Associated with nausea and fatigue.  No fever no neck pain.  No chest pain.  Headaches have been similar over the past 6 months.  This 1 is lasted longer than usual.  Started around 12 noon today.  Patient stated associated with some  shortness of breath.  Oxygen saturations 97% on room air.  No tachycardia.  No fevers.  Initial blood pressure was 185/96.        Past Medical History:  Diagnosis Date  . Anemia   . Diastolic dysfunction   . Endometriosis   . Hypertension   . Vitamin D deficiency     There are no problems to display for this patient.   Past Surgical History:  Procedure Laterality Date  . CESAREAN SECTION    . CHOLECYSTECTOMY    . TUBAL LIGATION       OB History   No obstetric history on file.     No family history on file.  Social History   Tobacco Use  . Smoking status: Never Smoker  . Smokeless tobacco: Never Used  Substance Use Topics  . Alcohol use: Yes    Comment: occ  . Drug use: No    Home Medications Prior to Admission medications   Medication Sig Start Date End Date Taking? Authorizing Provider  cyclobenzaprine (FLEXERIL) 10 MG tablet Take 1 tablet (10 mg total) by mouth 3 (three) times daily as needed for muscle spasms. 04/21/15   Geoffery Lyons, MD  enalapril-hydrochlorothiazide (VASERETIC) 10-25 MG tablet enalapril 10 mg-hydrochlorothiazide 25 mg tablet    [provider]    enalapril-hydrochlorothiazide (VASERETIC) 10-25 MG tablet Take 1 tablet by mouth daily. 12/27/19   Vanetta Mulders, MD  naproxen (NAPROSYN) 500 MG tablet Take 1 tablet (500 mg total) by mouth 2 (two) times daily. 04/21/15   Geoffery Lyons, MD    Allergies    Doxycycline  Review of Systems   Review of Systems  Constitutional: Positive for fatigue. Negative for chills and fever.  HENT: Negative for congestion, rhinorrhea and sore throat.   Eyes: Negative for visual disturbance.  Respiratory: Positive for shortness of breath. Negative for cough.   Cardiovascular: Negative for chest pain and leg swelling.  Gastrointestinal: Positive for nausea. Negative for abdominal pain, diarrhea and vomiting.  Genitourinary: Negative for dysuria.  Musculoskeletal: Negative for back pain and neck pain.  Skin: Negative for rash.  Neurological: Positive for headaches. Negative for dizziness and light-headedness.  Hematological: Does not bruise/bleed easily.  Psychiatric/Behavioral: Negative for confusion.    Physical Exam Updated Vital Signs BP (!) 158/92   Pulse 72   Temp 98.2 F (36.8 C)   Resp 16   Ht 1.651 m (5\' 5" )   Wt 105.7 kg   LMP 04/02/2015 (Within Days)   SpO2 98%   BMI 38.77 kg/m   Physical Exam Vitals and nursing note reviewed.  Constitutional:  General: She is not in acute distress.    Appearance: She is well-developed.  HENT:     Head: Normocephalic and atraumatic.  Eyes:     Extraocular Movements: Extraocular movements intact.     Conjunctiva/sclera: Conjunctivae normal.     Pupils: Pupils are equal, round, and reactive to light.  Cardiovascular:     Rate and Rhythm: Normal rate and regular rhythm.     Heart sounds: No murmur heard.   Pulmonary:     Effort: Pulmonary effort is normal. No respiratory distress.     Breath sounds: Normal breath sounds.  Chest:     Chest wall: No tenderness.  Abdominal:     Palpations: Abdomen is soft.     Tenderness: There is no  abdominal tenderness.  Musculoskeletal:        General: Normal range of motion.     Cervical back: Normal range of motion and neck supple.  Skin:    General: Skin is warm and dry.     Capillary Refill: Capillary refill takes less than 2 seconds.  Neurological:     General: No focal deficit present.     Mental Status: She is alert and oriented to person, place, and time.     Cranial Nerves: No cranial nerve deficit.     Sensory: No sensory deficit.     Motor: No weakness.     ED Results / Procedures / Treatments   Labs (all labs ordered are listed, but only abnormal results are displayed) Labs Reviewed  BASIC METABOLIC PANEL - Abnormal; Notable for the following components:      Result Value   Glucose, Bld 105 (*)    Calcium 8.6 (*)    All other components within normal limits  CBC WITH DIFFERENTIAL/PLATELET    EKG EKG Interpretation  Date/Time:  Tuesday December 26 2019 19:42:22 EDT Ventricular Rate:  65 PR Interval:  186 QRS Duration: 82 QT Interval:  420 QTC Calculation: 436 R Axis:   55 Text Interpretation: Normal sinus rhythm Cannot rule out Anterior infarct , age undetermined Abnormal ECG No significant change since last tracing Confirmed by Vanetta Mulders [phone]) on 12/26/2019 9:54:12 PM   Radiology CT Head Wo Contrast  Result Date: 12/26/2019 CLINICAL DATA:  Headaches for several hours EXAM: CT HEAD WITHOUT CONTRAST TECHNIQUE: Contiguous axial images were obtained from the base of the skull through the vertex without intravenous contrast. COMPARISON:  None. FINDINGS: Brain: No evidence of acute infarction, hemorrhage, hydrocephalus, extra-axial collection or mass lesion/mass effect. Vascular: No hyperdense vessel or unexpected calcification. Skull: Normal. Negative for fracture or focal lesion. Sinuses/Orbits: No acute finding. Other: None. IMPRESSION: No acute abnormality noted. Electronically Signed   By: Alcide Clever M.D.   On: 12/26/2019 22:39   DG Chest Port 1  View  Result Date: 12/26/2019 CLINICAL DATA:  Shortness of breath. EXAM: PORTABLE CHEST 1 VIEW COMPARISON:  05/20/2019 FINDINGS: The cardiomediastinal contours are normal. The lungs are clear. Pulmonary vasculature is normal. No consolidation, pleural effusion, or pneumothorax. No acute osseous abnormalities are seen. IMPRESSION: No acute chest findings. Electronically Signed   By: Narda Rutherford M.D.   On: 12/26/2019 22:29    Procedures Procedures (including critical care time)  Medications Ordered in ED Medications  0.9 %  sodium chloride infusion ( Intravenous Not Given 12/26/19 2301)  sodium chloride 0.9 % bolus 1,000 mL (1,000 mLs Intravenous New Bag/Given 12/26/19 2243)  dexamethasone (DECADRON) injection 10 mg (10 mg Intravenous Given 12/26/19 2248)  metoCLOPramide (  REGLAN) injection 10 mg (10 mg Intravenous Given 12/26/19 2249)  diphenhydrAMINE (BENADRYL) injection 25 mg (25 mg Intravenous Given 12/26/19 2245)  enalapril (VASOTEC) tablet 10 mg (10 mg Oral Given 12/26/19 2243)  hydrochlorothiazide (HYDRODIURIL) tablet 25 mg (25 mg Oral Given 12/26/19 2351)    ED Course  I have reviewed the triage vital signs and the nursing notes.  Pertinent labs & imaging results that were available during my care of the patient were reviewed by me and considered in my medical decision making (see chart for details).    MDM Rules/Calculators/A&P                            Headache somewhat migraine-like than the fact that has been kind of recurrent over the past 6 months.  Patient treated migraine cocktail IV normal saline Benadryl Reglan Decadron.  Patient feeling significantly better.  Patient also treated with her blood pressure medicine.  And blood pressure showing signs of improvement 158/92.  CT head negative chest x-ray negative.  We will refer patient to neurology for further follow-up for the headache disorder.  And she has follow-up with her primary care doctor later this week for the blood  pressure.  We will restart her on her blood pressure medicines. Final Clinical Impression(s) / ED Diagnoses Final diagnoses:  Headache disorder  Essential hypertension    Rx / DC Orders ED Discharge Orders         Ordered    enalapril-hydrochlorothiazide (VASERETIC) 10-25 MG tablet  Daily     Discontinue  Reprint     12/27/19 0002           Vanetta Mulders, MD 12/27/19 0007

## 2019-12-27 NOTE — Discharge Instructions (Addendum)
Restart your high blood pressure medication.  Make an appointment with your regular doctor to have that checked.  You already have a scheduled later this week for that.  Also make an appointment to follow-up with neurology.  Headaches do seem like it could be migraine in nature.  So following up neurology will be important.  Rest tomorrow and stay out of the bright sun.

## 2021-08-19 IMAGING — CT CT HEAD W/O CM
3 series · 15 of 47 positions shown, 18 images · non-contrast
Comparison: None.

CLINICAL DATA: Headaches for several hours

EXAM:
CT HEAD WITHOUT CONTRAST
TECHNIQUE: Contiguous axial images were obtained from the base of the skull
through the vertex without intravenous contrast.

[Series 2: head wo · axial · 0.49mm/px · z∈[+1124,+1249]mm · 9 of 30 slices shown, 12 images]
[im 3/30  brain]
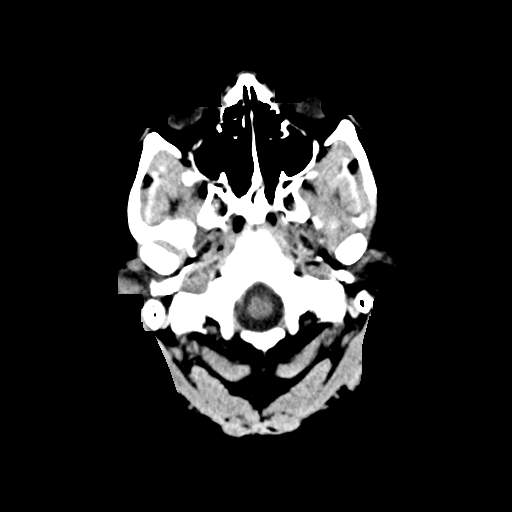
[im 3/30  bone]
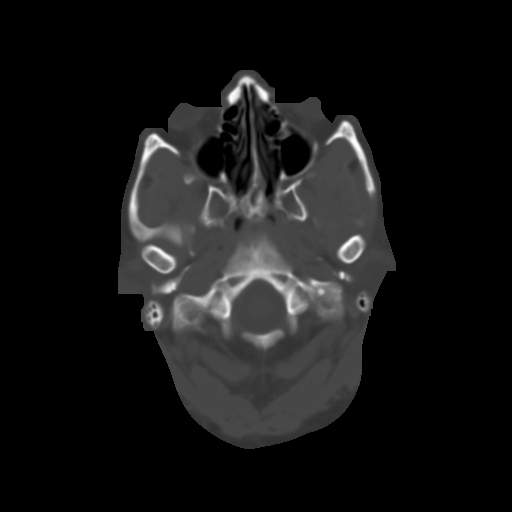
[im 6/30  brain]
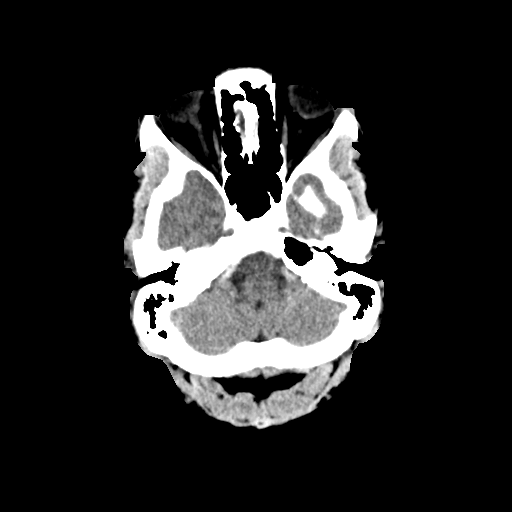
[im 9/30  brain]
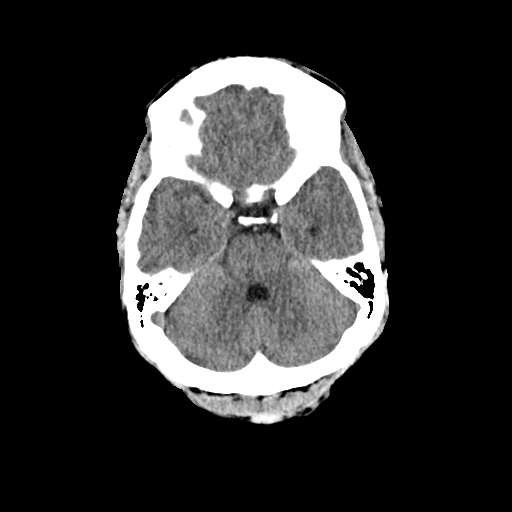
[im 12/30  brain]
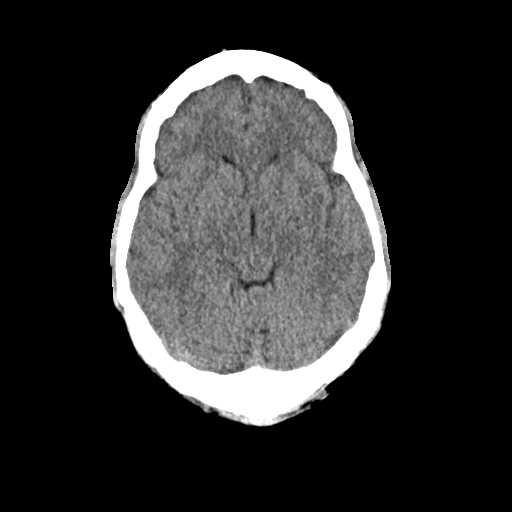
[im 16/30  brain]
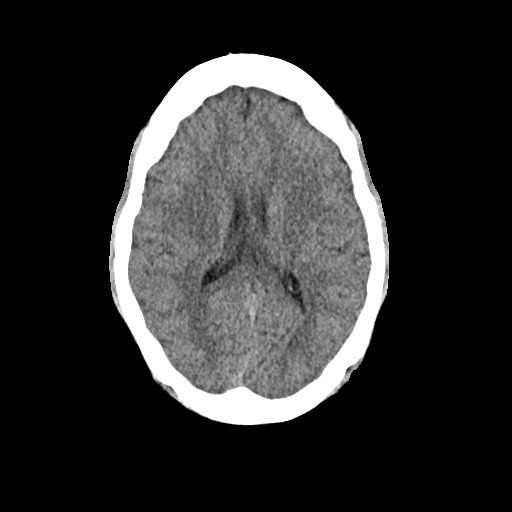
[im 16/30  bone]
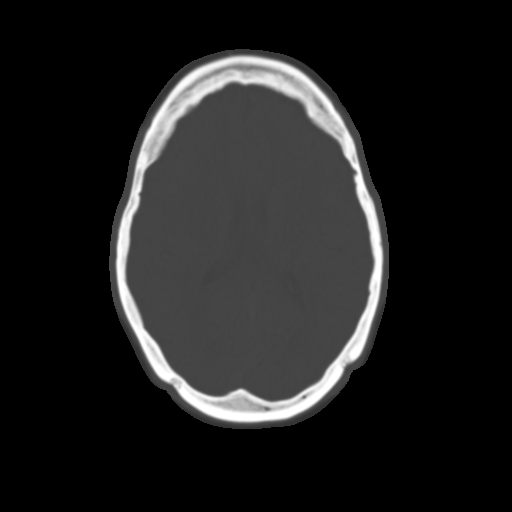
[im 19/30  brain]
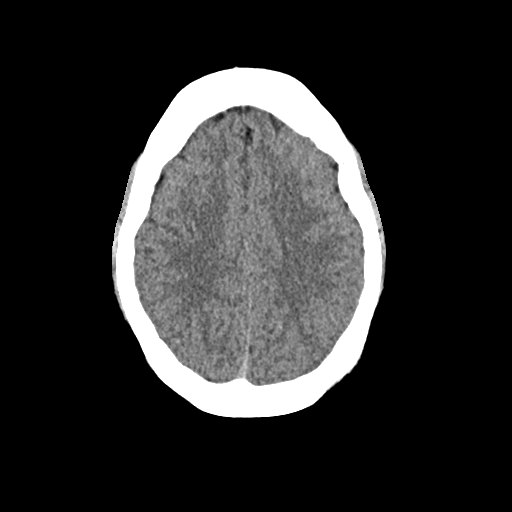
[im 22/30  brain]
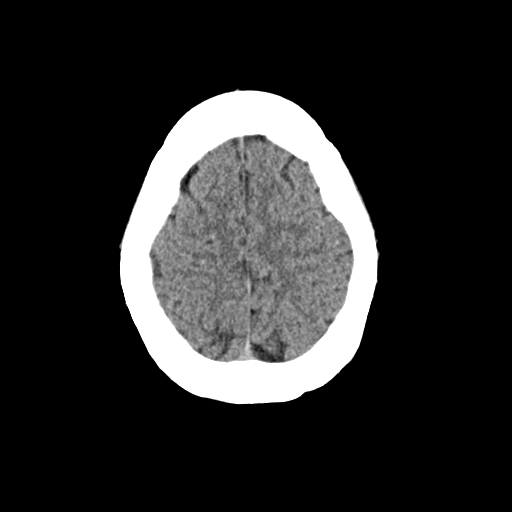
[im 25/30  brain]
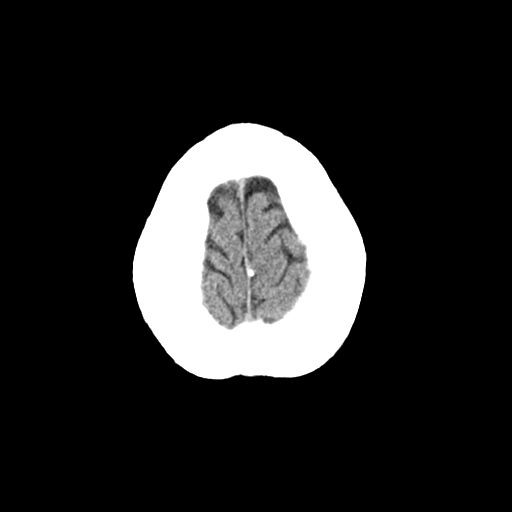
[im 28/30  brain]
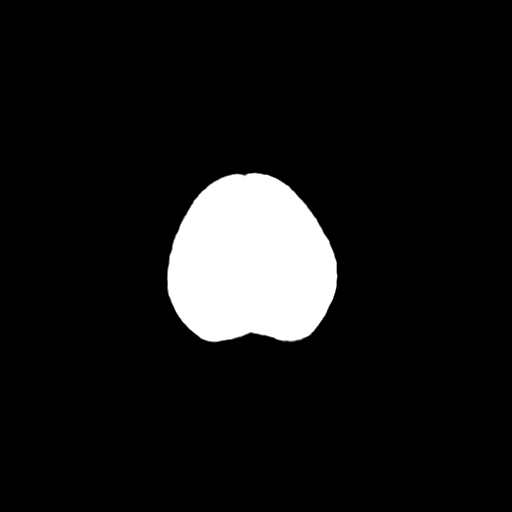
[im 28/30  bone]
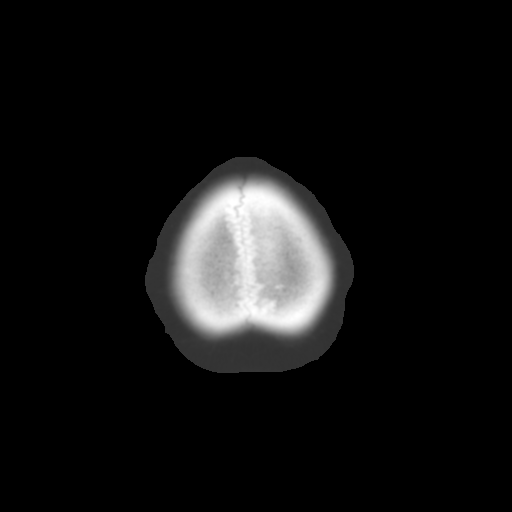

[Series 4: coronal soft · coronal · 0.29mm/px · 3 of 75 slices shown]
[im 25/75  brain]
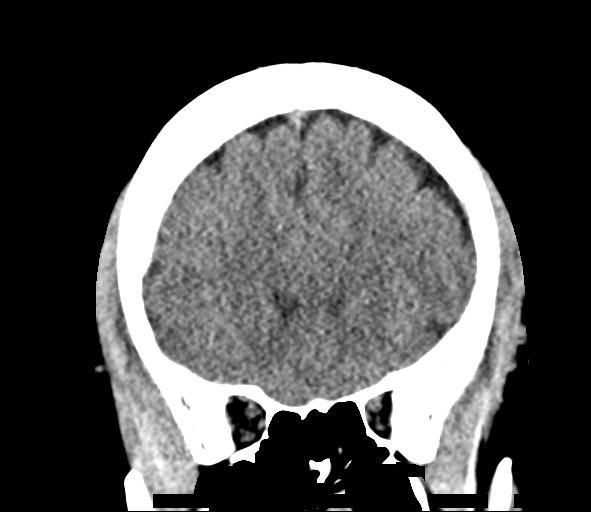
[im 33/75  brain]
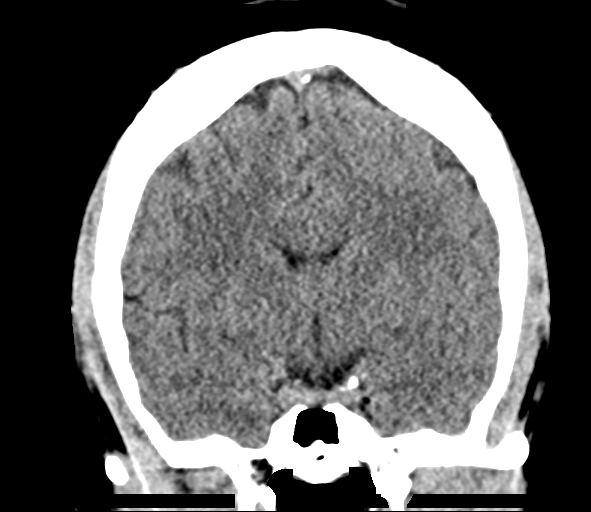
[im 42/75  brain]
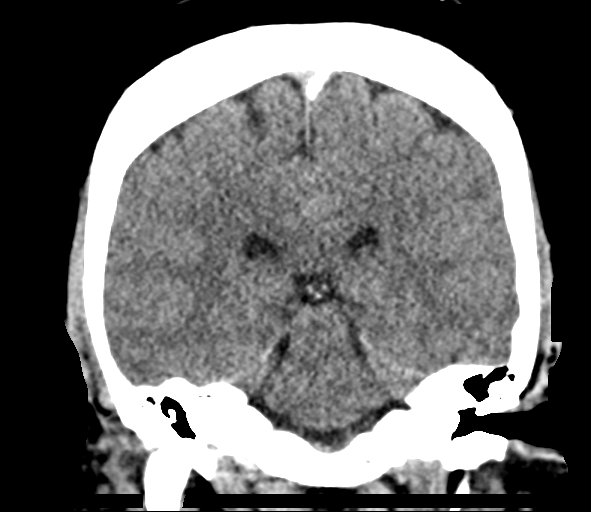

[Series 5: sag soft · sagittal · 0.29mm/px · 3 of 65 slices shown]
[im 22/65  brain]
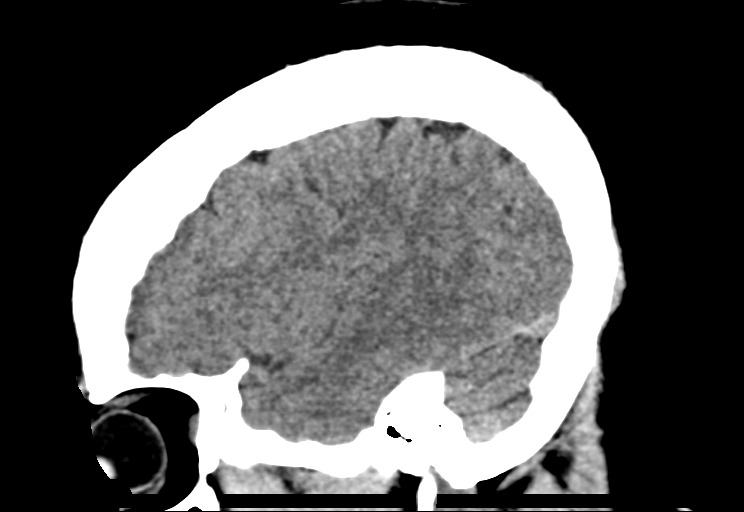
[im 33/65  brain]
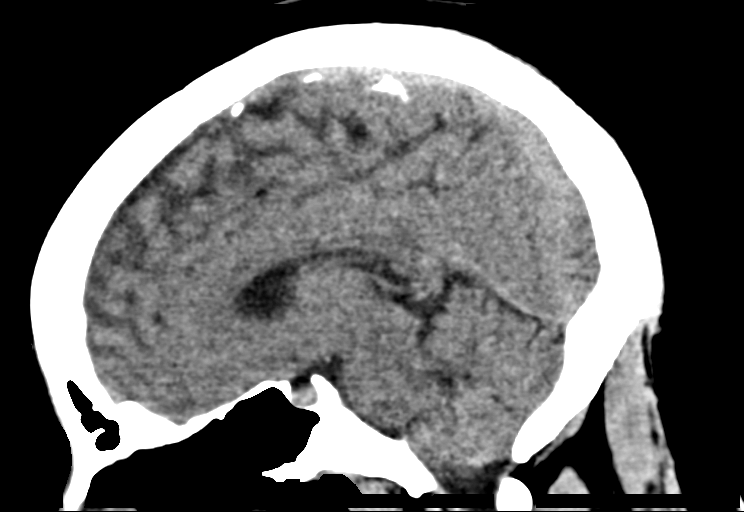
[im 43/65  brain]
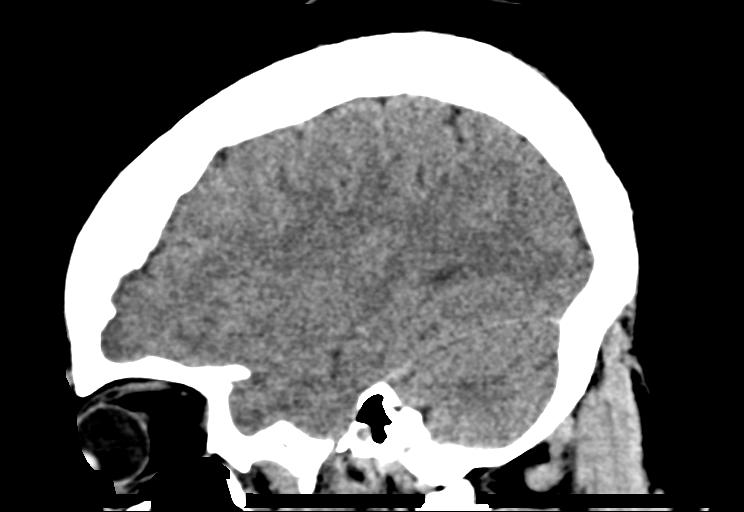

[15 of 47 positions shown; findings below may reference images not displayed]

FINDINGS: Brain: No evidence of acute infarction, hemorrhage, hydrocephalus,
extra-axial collection or mass lesion/mass effect.

Vascular: No hyperdense vessel or unexpected calcification.

Skull: Normal. Negative for fracture or focal lesion.

Sinuses/Orbits: No acute finding.

Other: None.
IMPRESSION: No acute abnormality noted.

## 2021-10-21 ENCOUNTER — Emergency Department (HOSPITAL_BASED_OUTPATIENT_CLINIC_OR_DEPARTMENT_OTHER): Payer: BC Managed Care – PPO

## 2021-10-21 ENCOUNTER — Encounter (HOSPITAL_BASED_OUTPATIENT_CLINIC_OR_DEPARTMENT_OTHER): Payer: Self-pay

## 2021-10-21 ENCOUNTER — Emergency Department (HOSPITAL_BASED_OUTPATIENT_CLINIC_OR_DEPARTMENT_OTHER)
Admission: EM | Admit: 2021-10-21 | Discharge: 2021-10-21 | Disposition: A | Discharge status: Home / Self Care | Payer: BC Managed Care – PPO | Attending: Emergency Medicine | Admitting: Emergency Medicine

## 2021-10-21 ENCOUNTER — Other Ambulatory Visit: Payer: Self-pay

## 2021-10-21 DIAGNOSIS — R110 Nausea: Secondary | ICD-10-CM | POA: Insufficient documentation

## 2021-10-21 DIAGNOSIS — I110 Hypertensive heart disease with heart failure: Secondary | ICD-10-CM | POA: Insufficient documentation

## 2021-10-21 DIAGNOSIS — Z79899 Other long term (current) drug therapy: Secondary | ICD-10-CM | POA: Diagnosis not present

## 2021-10-21 DIAGNOSIS — I509 Heart failure, unspecified: Secondary | ICD-10-CM | POA: Diagnosis not present

## 2021-10-21 DIAGNOSIS — R0602 Shortness of breath: Secondary | ICD-10-CM | POA: Insufficient documentation

## 2021-10-21 DIAGNOSIS — R0789 Other chest pain: Secondary | ICD-10-CM | POA: Diagnosis not present

## 2021-10-21 DIAGNOSIS — R079 Chest pain, unspecified: Secondary | ICD-10-CM | POA: Diagnosis present

## 2021-10-21 LAB — CBC
HCT: 43.1 % (ref 36.0–46.0)
Hemoglobin: 14.1 g/dL (ref 12.0–15.0)
MCH: 29.8 pg (ref 26.0–34.0)
MCHC: 32.7 g/dL (ref 30.0–36.0)
MCV: 91.1 fL (ref 80.0–100.0)
Platelets: 223 10*3/uL (ref 150–400)
RBC: 4.73 MIL/uL (ref 3.87–5.11)
RDW: 12.4 % (ref 11.5–15.5)
WBC: 4.5 10*3/uL (ref 4.0–10.5)
nRBC: 0 % (ref 0.0–0.2)

## 2021-10-21 LAB — BRAIN NATRIURETIC PEPTIDE: B Natriuretic Peptide: 8.7 pg/mL (ref 0.0–100.0)

## 2021-10-21 LAB — BASIC METABOLIC PANEL
Anion gap: 9 (ref 5–15)
BUN: 11 mg/dL (ref 6–20)
CO2: 31 mmol/L (ref 22–32)
Calcium: 9.3 mg/dL (ref 8.9–10.3)
Chloride: 99 mmol/L (ref 98–111)
Creatinine, Ser: 0.76 mg/dL (ref 0.44–1.00)
GFR, Estimated: >60 mL/min (ref >60)
Glucose, Bld: 93 mg/dL (ref 70–99)
Potassium: 3.7 mmol/L (ref 3.5–5.1)
Sodium: 139 mmol/L (ref 135–145)

## 2021-10-21 LAB — TROPONIN I (HIGH SENSITIVITY)
Troponin I (High Sensitivity): <2 ng/L (ref <18)
Troponin I (High Sensitivity): 2 ng/L (ref <18)

## 2021-10-21 NOTE — ED Provider Notes (Signed)
?[facility] EMERGENCY DEPT ?Provider Note ? ? ?CSN: [id number] ?Arrival date & time: 10/21/21  2683 ? ?  ? ?History ? ?Chief Complaint  ?Patient presents with  ? Chest Pain  ? ? ?Paige Watson is a 51 y.o. female with a PMHx of pre-DM, HTN, CHF, who presents to the Emergency Department complaining of 6/10 left-sided chest pain onset 1 hour ago prior to arrival.  Patient denies her chest pain radiating anywhere.  Patient is in the process of obtaining a new cardiologist at this time.  Has associated nausea, shortness of breath.  Has not tried medications prior to arrival.  Denies fever, chills, cough, rhinorrhea, nasal congestion, abdominal pain, vomiting. Denies prior MI, cardiac catheterization.  Patient notes she is compliant with her medications. ? ? ?The history is provided by the patient. No language interpreter was used.  ? ?  ? ?Home Medications ?Prior to Admission medications   ?Medication Sig Start Date End Date Taking? Authorizing Provider  ?cyclobenzaprine (FLEXERIL) 10 MG tablet Take 1 tablet (10 mg total) by mouth 3 (three) times daily as needed for muscle spasms. 04/21/15   Geoffery Lyons, MD  ?enalapril-hydrochlorothiazide (VASERETIC) 10-25 MG tablet enalapril 10 mg-hydrochlorothiazide 25 mg tablet    [provider]  ?enalapril-hydrochlorothiazide (VASERETIC) 10-25 MG tablet Take 1 tablet by mouth daily. 12/27/19   Vanetta Mulders, MD  ?naproxen (NAPROSYN) 500 MG tablet Take 1 tablet (500 mg total) by mouth 2 (two) times daily. 04/21/15   Geoffery Lyons, MD  ?   ? ?Allergies    ?Doxycycline   ? ?Review of Systems   ?Review of Systems  ?Constitutional:  Negative for chills and fever.  ?Respiratory:  Positive for shortness of breath. Negative for cough.   ?Cardiovascular:  Positive for chest pain.  ?Gastrointestinal:  Positive for nausea. Negative for abdominal pain and vomiting.  ?All other systems reviewed and are negative. ? ?Physical Exam ?Updated Vital Signs ?BP 138/89   Pulse  63   Temp 97.6 ?F (36.4 ?C) (Oral)   Resp 15   Ht 5\' 5"  (1.651 m)   Wt 98 kg   LMP 04/02/2015 (Within Days)   SpO2 100%   BMI 35.94 kg/m?  ?Physical Exam ?Vitals and nursing note reviewed.  ?Constitutional:   ?   General: She is not in acute distress. ?   Appearance: She is not diaphoretic.  ?HENT:  ?   Head: Normocephalic and atraumatic.  ?   Mouth/Throat:  ?   Pharynx: No oropharyngeal exudate.  ?   Comments: Uvula midline without swelling.  Patent airway.  No posterior pharyngeal erythema or tonsillar exudate. ?Eyes:  ?   General: No scleral icterus. ?   Conjunctiva/sclera: Conjunctivae normal.  ?Cardiovascular:  ?   Rate and Rhythm: Normal rate and regular rhythm.  ?   Pulses: Normal pulses.  ?   Heart sounds: Normal heart sounds.  ?Pulmonary:  ?   Effort: Pulmonary effort is normal. No respiratory distress.  ?   Breath sounds: Normal breath sounds. No wheezing.  ?Chest:  ?   Chest wall: Tenderness present.  ?   Comments: Tenderness to palpation noted to left chest wall.  No overlying skin changes.  No obvious deformity. ?Abdominal:  ?   General: Bowel sounds are normal.  ?   Palpations: Abdomen is soft. There is no mass.  ?   Tenderness: There is no abdominal tenderness. There is no guarding or rebound.  ?Musculoskeletal:     ?   General:  Normal range of motion.  ?   Cervical back: Normal range of motion and neck supple.  ?   Comments: No edema noted to bilateral lower extremities.  ?Skin: ?   General: Skin is warm and dry.  ?Neurological:  ?   Mental Status: She is alert.  ?Psychiatric:     ?   Behavior: Behavior normal.  ? ? ?ED Results / Procedures / Treatments   ?Labs ?(all labs ordered are listed, but only abnormal results are displayed) ?Labs Reviewed  ?BASIC METABOLIC PANEL  ?CBC  ?BRAIN NATRIURETIC PEPTIDE  ?TROPONIN I (HIGH SENSITIVITY)  ?TROPONIN I (HIGH SENSITIVITY)  ? ? ?EKG ?EKG Interpretation ? ?Date/Time:  Tuesday Oct 21 2021 09:35:13 EDT ?Ventricular Rate:  67 ?PR Interval:  158 ?QRS  Duration: 103 ?QT Interval:  405 ?QTC Calculation: 428 ?R Axis:   70 ?Text Interpretation: Sinus rhythm Confirmed by Alona Bene [phone]) on 10/21/2021 9:42:29 AM ? ?Radiology ?DG Chest Portable 1 View ? ?Result Date: 10/21/2021 ?CLINICAL DATA:  Chest pain Pressured Shortness of breath Nausea EXAM: PORTABLE CHEST - 1 VIEW COMPARISON:  12/26/2019 FINDINGS: Cardiomediastinal silhouette and pulmonary vasculature are within normal limits. Lungs are clear. IMPRESSION: No acute cardiopulmonary process. Electronically Signed   By: Acquanetta Belling M.D.   On: 10/21/2021 10:00   ? ?Procedures ?Procedures  ? ? ?Medications Ordered in ED ?Medications - No data to display ? ?ED Course/ Medical Decision Making/ A&P ?Clinical Course as of 10/21/21 1606  ?Tue Oct 21, 2021  ?1025 Discussion with patient regarding lab and imaging findings. Answered all available questions. [SB]  ?1120 In depth conversation with patient regarding management in outpatient setting.  Further conversation with patient and noted that patient has an lots of stressors at home as well as at work.  Patient notes she has been unable to take time for herself. [SB]  ?1231 Patient reevaluated and resting comfortably on stretcher.  Discussed discharge treatment plan with patient at bedside.  Answered all available questions.  Patient appears safe for discharge at this time. [SB]  ?  ?Clinical Course User Index ?[SB] Eh Sesay A, PA-C  ? ?                        ?Medical Decision Making ?Amount and/or Complexity of Data Reviewed ?Labs: ordered. ?Radiology: ordered. ? ? ?Patient presents to the ED with 6/10 left-sided chest pain onset 1 hour prior to arrival to the ED.  Her chest pain is nonradiating.  Has a past medical history of hypertension and is prediabetic otherwise denies cardiac history.  No history of asthma or COPD.  Vital signs stable, patient afebrile.  On exam patient with tenderness to palpation noted to left chest wall without overlying skin changes or  obvious deformity.  No edema noted to bilateral lower extremities.  Otherwise no acute cardiovascular, respiratory, abdominal exam findings.  Differential diagnosis includes ACS, aortic dissection, pneumothorax, PNA.  ? ?EKG:  ?NSR.  No acute ST/T changes. ? ?Labs:  ?I ordered, and personally interpreted labs.  The pertinent results include:   ?Initial troponin <2 delta troponin at 2 and unremarkable ?CBC, BMP, BNP unremarkable ? ?Imaging: ?I ordered imaging studies including CXR ?I independently visualized and interpreted imaging which showed: No acute cardiopulmonary process ?I agree with the radiologist interpretation ? ? ?Disposition: ?Presentation suspicious for atypical chest pain. EKG without acute ST/T changes, troponins negative, chest x-ray negative, low suspicion for ACS at this time. Chest x-ray without acute  findings, vital signs stable, doubt aortic dissection or pneumothorax at this time.  Doubt pneumonia at this time, patient afebrile, no acute findings on chest x-ray. Heart score, patient at low risk. Case discussed with attending who agrees with discharge treatment plan at this time. After consideration of the diagnostic results and the patients response to treatment, I feel that the patient would benefit from Discharge home. Patient has a follow-up appointment with her cardiologist in [place] in June.  Will provide patient with ambulatory referral to cardiology locally in the area to establish care in [facility]. Supportive care measures and strict return precautions discussed with patient at bedside. Pt acknowledges and verbalizes understanding. Pt appears safe for discharge. Follow up as indicated in discharge paperwork. ? ? ?This chart was dictated using voice recognition software, Dragon. Despite the best efforts of this provider to proofread and correct errors, errors may still occur which can change documentation meaning. ? ?Final Clinical Impression(s) / ED Diagnoses ?Final diagnoses:   ?Atypical chest pain  ? ? ?Rx / DC Orders ?ED Discharge Orders   ? ?      Ordered  ?  Ambulatory referral to Cardiology       ?Comments: If you have not heard from the Cardiology office within the next 72 hours

## 2021-10-21 NOTE — ED Triage Notes (Signed)
Onset today within an hour mid sternal chest pressure radiating to left chest.  Associated with nausea and short of breath. ?

## 2021-10-21 NOTE — Discharge Instructions (Addendum)
It was a pleasure taking care of you today!  ? ?Your workup was negative in the ED. You may apply ice or heat to the affected area for 15 minutes at a time.  Ensure to place a barrier between your skin and and the ice.  You may use over-the-counter 1,000 mg Tylenol every 6 hours or 600 mg ibuprofen every 6 hours as needed for pain.  Follow-up with your primary care provider for evaluation of your symptoms.  I have sent ambulatory referral to cardiology, they will call within the next 48 hours to set up a follow-up appointment regarding today's ED visit.  Maintain your scheduled follow-up appointment with your cardiologist in [place].  You may speak with your care team in [place] regarding today's ED visit to see if they would like to schedule a closer follow-up appointment.  You may return to the ED if you are experiencing increasing/worsening chest pain, shortness of breath, or worsening symptoms. ?

## 2021-10-21 NOTE — ED Notes (Signed)
X-ray at bedside

## 2021-10-22 ENCOUNTER — Encounter: Payer: Self-pay | Admitting: Interventional Cardiology

## 2021-10-22 ENCOUNTER — Ambulatory Visit (INDEPENDENT_AMBULATORY_CARE_PROVIDER_SITE_OTHER): Payer: No Typology Code available for payment source | Admitting: Interventional Cardiology

## 2021-10-22 VITALS — BP 118/64 | HR 80 | Ht 65.0 in | Wt 215.8 lb

## 2021-10-22 DIAGNOSIS — R0789 Other chest pain: Secondary | ICD-10-CM

## 2021-10-22 DIAGNOSIS — R5383 Other fatigue: Secondary | ICD-10-CM | POA: Diagnosis not present

## 2021-10-22 DIAGNOSIS — I5189 Other ill-defined heart diseases: Secondary | ICD-10-CM

## 2021-10-22 NOTE — Patient Instructions (Signed)
Medication Instructions:  ?Your physician recommends that you continue on your current medications as directed. Please refer to the Current Medication list given to you today. ? ?*If you need a refill on your cardiac medications before your next appointment, please call your pharmacy* ? ?Lab Work: ?NONE ? ?Testing/Procedures: ?NONE ? ?Follow-Up: ?At [facility], you and your health needs are our priority.  As part of our continuing mission to provide you with exceptional heart care, we have created designated Provider Care Teams.  These Care Teams include your primary Cardiologist (physician) and Advanced Practice Providers (APPs -  Physician Assistants and Nurse Practitioners) who all work together to provide you with the care you need, when you need it. ? ?Your next appointment:   ?As needed ? ?The format for your next appointment:   ?In Person ? ?Provider:   ?Lesleigh Noe, MD { ? ?Other Instructions ?Please call our office if you continue to have episodes of chest tightness. [phone] ? ?Important Information About Sugar ? ? ? ? ?  ?

## 2021-10-22 NOTE — Progress Notes (Signed)
?Cardiology Office Note:   ? ?Date:  10/22/2021  ? ?ID:  Paige Watson, DOB 12-25-1970, MRN [id number] ? ?PCP:  System, Provider Not In  ?Cardiologist:  Sinclair Grooms, MD  ? ?Referring MD: Oren Binet, Soijett A, PA-C  ? ?Chief Complaint  ?Patient presents with  ? Advice Only  ?  Chest discomfort  ? ? ?History of Present Illness:   ? ?Paige Watson is a 51 y.o. female with a hx of seen in emergency room on 10/21/2021 with chest pain.  Other significant medical problems include anemia and hypertension.  She does not smoke. ? ? ? 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  Yesterday there was a particular smell in the office due to perfume that one of her coworkers was wearing.  This started causing her to feel some sense of nausea which led to a feeling of chest tightness and eventually dizziness.  It caused her to feel weak.  She felt as though she might even be developing some level of disorientation.  She has not been sleeping well because her son has metastatic cancer, and she has been prepping for her job even after she comes home from work late into the late hours of the night.  The symptoms were protracted yesterday and eventually led to the emergency room visit at [facility] the disposition on that emergency room visit is noted as follows: ? ?The disposition on 10/21/2021 is as follows: ? ?Presentation suspicious for atypical chest pain. EKG without acute ST/T changes, troponins negative, chest x-ray negative, low suspicion for ACS at this time. Chest x-ray without acute findings, vital signs stable, doubt aortic dissection or pneumothorax at this time.  Doubt pneumonia at this time, patient afebrile, no acute findings on chest x-ray. Heart score, patient at low risk. Case discussed with attending who agrees with  discharge treatment plan at this time. After consideration of the diagnostic results and the patients response to treatment, I feel that the patient would benefit from Discharge home. Patient has a follow-up appointment with her cardiologist in [place] in June.  Will provide patient with ambulatory referral to cardiology locally in the area to establish care in [facility]. Supportive care measures and strict return precautions discussed with patient at bedside. Pt acknowledges and verbalizes understanding. Pt appears safe for discharge. Follow up as indicated in discharge paperwork. ? ?She get a good night sleep.  She feels great today.  No chest discomfort.  Her primary care doctor wrote for her to be off work for several days.  Today she really has no concerns but wanted to follow-up and make sure that we did not see a signal from the work-up that there was a cardiac issue. ? ?Past Medical History:  ?Diagnosis Date  ? Anemia   ? Diastolic dysfunction   ? Endometriosis   ? Hypertension   ? Vitamin D deficiency   ? ? ?Past Surgical History:  ?Procedure Laterality Date  ? CESAREAN SECTION    ? CHOLECYSTECTOMY    ? TUBAL LIGATION    ? ? ?Current Medications: ?Current Meds  ?Medication Sig  ? cyclobenzaprine (FLEXERIL) 10 MG tablet Take 1 tablet (10 mg total) by mouth 3 (three) times daily as needed for muscle spasms.  ? enalapril-hydrochlorothiazide (VASERETIC) 10-25 MG  tablet Take 1 tablet by mouth daily.  ?  ? ?Allergies:   Pineapple, Doxycycline, and Kiwi extract  ? ?Social History  ? ?Socioeconomic History  ? Marital status: Married  ?  Spouse name: Not on file  ? Number of children: Not on file  ? Years of education: Not on file  ? Highest education level: Not on file  ?Occupational History  ? Not on file  ?Tobacco Use  ? Smoking status: Never  ? Smokeless tobacco: Never  ?Substance and Sexual Activity  ? Alcohol use: Yes  ?  Comment: occ  ? Drug use: No  ? Sexual activity: Not on file  ?Other Topics Concern   ? Not on file  ?Social History Narrative  ? Not on file  ? ?Social Determinants of Health  ? ?Financial Resource Strain: Not on file  ?Food Insecurity: Not on file  ?Transportation Needs: Not on file  ?Physical Activity: Not on file  ?Stress: Not on file  ?Social Connections: Not on file  ?  ? ?Family History: ?The patient's family history is not on file. ? ?ROS:   ?Please see the history of present illness.    ?No new data other than as noted above.  All other systems reviewed and are negative. ? ?EKGs/Labs/Other Studies Reviewed:   ? ?The following studies were reviewed today: ?Unable to find prior cardiac data in my chart or in [facility] records.  She has had an echocardiogram in the past and was told she had diastolic dysfunction. ? ?EKG:  EKG performed on 10/21/2021 is normal. ? ?Recent Labs: ?10/21/2021: B Natriuretic Peptide 8.7; BUN 11; Creatinine, Ser 0.76; Hemoglobin 14.1; Platelets 223; Potassium 3.7; Sodium 139  ?Recent Lipid Panel ?No results found for: CHOL, TRIG, HDL, CHOLHDL, VLDL, LDLCALC, LDLDIRECT ? ?Physical Exam:   ? ?VS:  BP 118/64   Pulse 80   Ht 5\' 5"  (1.651 m)   Wt 215 lb 12.8 oz (97.9 kg)   LMP 04/02/2015 (Within Days)   SpO2 97%   BMI 35.91 kg/m?    ? ?Wt Readings from Last 3 Encounters:  ?10/22/21 215 lb 12.8 oz (97.9 kg)  ?10/21/21 216 lb (98 kg)  ?12/26/19 233 lb (105.7 kg)  ?  ? ?GEN: Overweight. No acute distress ?HEENT: Normal ?NECK: No JVD. ?LYMPHATICS: No lymphadenopathy ?CARDIAC: No murmur. RRR no gallop, or edema. ?VASCULAR:  Normal Pulses. No bruits. ?RESPIRATORY:  Clear to auscultation without rales, wheezing or rhonchi  ?ABDOMEN: Soft, non-tender, non-distended, No pulsatile mass, ?MUSCULOSKELETAL: No deformity  ?SKIN: Warm and dry ?NEUROLOGIC:  Alert and oriented x 3 ?PSYCHIATRIC:  Normal affect  ? ?ASSESSMENT:   ? ?1. Chest discomfort   ?2. Other fatigue   ?3. Diastolic dysfunction   ? ?PLAN:   ? ?In order of problems listed above: ? ?The patient's sign and symptom complex of  chest discomfort with fatigue and weakness are not specific cardiac complaints.  There may have been some vagal overtone.  The whole event seem to be precipitated by a pungent odor from perfume that caused her to start feeling nausea swelling in her throat that since that she could not swallow and eventual chest tightness and dizziness.  This went on for a substantial period of time.  She was eventually seen in the [facility] center.  The work-up was negative.  EKG was reviewed, chest x-ray was reviewed, all laboratory data reviewed.  Plan is clinical observation and reassurance at this time.  No cardiac testing is recommended. ?Related  to stress and working long hours without getting proper sleep. ?Unable to verify this as I am not able to find the echocardiogram that was done in the past. ? ? ?Medication Adjustments/Labs and Tests Ordered: ?Current medicines are reviewed at length with the patient today.  Concerns regarding medicines are outlined above.  ?No orders of the defined types were placed in this encounter. ? ?No orders of the defined types were placed in this encounter. ? ? ?Patient Instructions  ?Medication Instructions:  ?Your physician recommends that you continue on your current medications as directed. Please refer to the Current Medication list given to you today. ? ?*If you need a refill on your cardiac medications before your next appointment, please call your pharmacy* ? ?Lab Work: ?NONE ? ?Testing/Procedures: ?NONE ? ?Follow-Up: ?At [facility], you and your health needs are our priority.  As part of our continuing mission to provide you with exceptional heart care, we have created designated Provider Care Teams.  These Care Teams include your primary Cardiologist (physician) and Advanced Practice Providers (APPs -  Physician Assistants and Nurse Practitioners) who all work together to provide you with the care you need, when you need it. ? ?Your next appointment:   ?As needed ? ?The  format for your next appointment:   ?In Person ? ?Provider:   ?Sinclair Grooms, MD { ? ?Other Instructions ?Please call our office if you continue to have episodes of chest tightness. [phone] ? ?[place]

## 2022-09-14 ENCOUNTER — Ambulatory Visit (INDEPENDENT_AMBULATORY_CARE_PROVIDER_SITE_OTHER): Payer: PRIVATE HEALTH INSURANCE | Admitting: Internal Medicine

## 2022-09-14 ENCOUNTER — Encounter: Payer: Self-pay | Admitting: Internal Medicine

## 2022-09-14 ENCOUNTER — Other Ambulatory Visit: Payer: Self-pay | Admitting: Internal Medicine

## 2022-09-14 VITALS — BP 128/78 | HR 70 | Ht 65.0 in | Wt 230.6 lb

## 2022-09-14 DIAGNOSIS — I10 Essential (primary) hypertension: Secondary | ICD-10-CM | POA: Diagnosis not present

## 2022-09-14 DIAGNOSIS — E782 Mixed hyperlipidemia: Secondary | ICD-10-CM

## 2022-09-14 DIAGNOSIS — R7302 Impaired glucose tolerance (oral): Secondary | ICD-10-CM

## 2022-09-14 DIAGNOSIS — F411 Generalized anxiety disorder: Secondary | ICD-10-CM | POA: Insufficient documentation

## 2022-09-14 DIAGNOSIS — G4733 Obstructive sleep apnea (adult) (pediatric): Secondary | ICD-10-CM | POA: Insufficient documentation

## 2022-09-14 DIAGNOSIS — R5383 Other fatigue: Secondary | ICD-10-CM | POA: Diagnosis not present

## 2022-09-14 MED ORDER — SEMAGLUTIDE(0.25 OR 0.5MG/DOS) 2 MG/3ML ~~LOC~~ SOPN: 0.2500 mg | PEN_INJECTOR | SUBCUTANEOUS | Status: DC

## 2022-09-14 MED ORDER — SEMAGLUTIDE(0.25 OR 0.5MG/DOS) 2 MG/3ML ~~LOC~~ SOPN
0.2500 mg | PEN_INJECTOR | SUBCUTANEOUS | Status: DC
Start: 2022-09-14 — End: 2022-09-14

## 2022-09-14 MED ORDER — SEMAGLUTIDE(0.25 OR 0.5MG/DOS) 2 MG/3ML ~~LOC~~ SOPN: 0.2500 mg | PEN_INJECTOR | SUBCUTANEOUS | 0 refills | Status: DC

## 2022-09-14 NOTE — Progress Notes (Signed)
Established Patient Office Visit  Subjective:  Patient ID: Paige Watson, female    DOB: 1970-12-08  Age: 52 y.o. MRN: [id number]  Chief Complaint  Patient presents with   Follow-up    Follow up no energy    Patient comes in for a follow-up.  She is reports that she is feeling better than before but feels very tired during the daytime.  Patient admits to loud snoring at night which her husband has noticed.  Patient was diagnosed with obstructive sleep apnea in 2021 and also had a CPAP titration.  However patient never picked up a CPAP machine because they were out of town.  And then the machines were on backorder.   Patient now understands that her sleep apnea needs to be treated with CPAP machines.  She will have a more restful night sleep and her energy level will be much better next morning.   Patient is fasting for blood work.  Patient is a prediabetic and has metabolic syndrome.  She is concerned about her weight.  Previously she was given a prescription of Ozempic to lose use but it was not approved by her insurance.  However we will get prior authorization and start her on Ozempic injections at 0.25 mg/week    No other concerns at this time.   Past Medical History:  Diagnosis Date   Anemia    Diastolic dysfunction    Endometriosis    Hypertension    Vitamin D deficiency     Past Surgical History:  Procedure Laterality Date   CESAREAN SECTION     CHOLECYSTECTOMY     TUBAL LIGATION      Social History   Socioeconomic History   Marital status: Married    Spouse name: Not on file   Number of children: Not on file   Years of education: Not on file   Highest education level: Not on file  Occupational History   Not on file  Tobacco Use   Smoking status: Never   Smokeless tobacco: Never  Substance and Sexual Activity   Alcohol use: Yes    Comment: occ   Drug use: No   Sexual activity: Not on file  Other Topics Concern   Not on file  Social History Narrative    Not on file   Social Determinants of Health   Financial Resource Strain: Not on file  Food Insecurity: Not on file  Transportation Needs: Not on file  Physical Activity: Not on file  Stress: Not on file  Social Connections: Not on file  Intimate Partner Violence: Not on file    Family History  Problem Relation Age of Onset   Hypertension Mother     Allergies  Allergen Reactions   Pineapple Anaphylaxis, Itching and Swelling    Other reaction(s): Swelling    Doxycycline     nausea   Kiwi Extract Swelling    Other reaction(s): Swelling     Review of Systems  Constitutional:  Positive for malaise/fatigue. Negative for chills, diaphoresis, fever and weight loss.  HENT: Negative.    Eyes: Negative.   Respiratory: Negative.    Cardiovascular: Negative.   Gastrointestinal: Negative.   Genitourinary: Negative.   Musculoskeletal: Negative.   Neurological: Negative.   Psychiatric/Behavioral: Negative.         Objective:   BP 128/78   Pulse 70   Ht 5\' 5"  (1.651 m)   Wt 230 lb 9.6 oz (104.6 kg)   LMP 04/02/2015 (Within Days)  SpO2 98%   BMI 38.37 kg/m   Vitals:   09/14/22 1312  BP: 128/78  Pulse: 70  Height: 5\' 5"  (1.651 m)  Weight: 230 lb 9.6 oz (104.6 kg)  SpO2: 98%  BMI (Calculated): 38.37    Physical Exam Vitals and nursing note reviewed.  Constitutional:      Appearance: Normal appearance. She is obese.  HENT:     Right Ear: Tympanic membrane normal.     Left Ear: Tympanic membrane normal.  Cardiovascular:     Rate and Rhythm: Normal rate and regular rhythm.     Pulses: Normal pulses.     Heart sounds: Normal heart sounds. No murmur heard. Pulmonary:     Effort: Pulmonary effort is normal.     Breath sounds: Normal breath sounds.  Abdominal:     General: Abdomen is flat.     Palpations: Abdomen is soft.  Musculoskeletal:        General: Normal range of motion.  Neurological:     General: No focal deficit present.     Mental Status:  She is alert and oriented to person, place, and time.      No results found for any visits on 09/14/22.  No results found for this or any previous visit (from the past 2160 hour(s)).    Assessment & Plan:  Will contact the CPAP DME company and request an auto titrating unit for the patient.  Labs today.  Prescription sent for Ozempic injections. Problem List Items Addressed This Visit     Impaired glucose tolerance   Relevant Orders   Hemoglobin A1c   Essential hypertension, benign - Primary   Relevant Orders   CMP14+EGFR   Fatigue   Relevant Orders   CBC With Differential   OSA (obstructive sleep apnea)   GAD (generalized anxiety disorder)   Other Visit Diagnoses     Mixed hyperlipidemia       Relevant Orders   Lipid Panel w/o Chol/HDL Ratio       Return in about 4 weeks (around 10/12/2022).   Total time spent: 30 minutes  Perrin Maltese, MD  09/14/2022

## 2022-09-15 LAB — CBC WITH DIFFERENTIAL
Basophils Absolute: 0 10*3/uL (ref 0.0–0.2)
Basos: 1 %
EOS (ABSOLUTE): 0.1 10*3/uL (ref 0.0–0.4)
Eos: 1 %
Hematocrit: 42.6 % (ref 34.0–46.6)
Hemoglobin: 14.1 g/dL (ref 11.1–15.9)
Immature Grans (Abs): 0 10*3/uL (ref 0.0–0.1)
Immature Granulocytes: 0 %
Lymphocytes Absolute: 2.2 10*3/uL (ref 0.7–3.1)
Lymphs: 47 %
MCH: 30 pg (ref 26.6–33.0)
MCHC: 33.1 g/dL (ref 31.5–35.7)
MCV: 91 fL (ref 79–97)
Monocytes Absolute: 0.5 10*3/uL (ref 0.1–0.9)
Monocytes: 10 %
Neutrophils Absolute: 1.9 10*3/uL (ref 1.4–7.0)
Neutrophils: 41 %
RBC: 4.7 x10E6/uL (ref 3.77–5.28)
RDW: 12.3 % (ref 11.7–15.4)
WBC: 4.7 10*3/uL (ref 3.4–10.8)

## 2022-09-15 LAB — CMP14+EGFR
ALT: 40 IU/L — ABNORMAL HIGH (ref 0–32)
AST: 40 IU/L (ref 0–40)
Albumin/Globulin Ratio: 1.5 (ref 1.2–2.2)
Albumin: 4.3 g/dL (ref 3.8–4.9)
Alkaline Phosphatase: 76 IU/L (ref 44–121)
BUN/Creatinine Ratio: 16 (ref 9–23)
BUN: 12 mg/dL (ref 6–24)
Bilirubin Total: 0.5 mg/dL (ref 0.0–1.2)
CO2: 27 mmol/L (ref 20–29)
Calcium: 9.2 mg/dL (ref 8.7–10.2)
Chloride: 101 mmol/L (ref 96–106)
Creatinine, Ser: 0.75 mg/dL (ref 0.57–1.00)
Globulin, Total: 2.9 g/dL (ref 1.5–4.5)
Glucose: 94 mg/dL (ref 70–99)
Potassium: 3.9 mmol/L (ref 3.5–5.2)
Sodium: 141 mmol/L (ref 134–144)
Total Protein: 7.2 g/dL (ref 6.0–8.5)
eGFR: 95 mL/min/{1.73_m2} (ref >59)

## 2022-09-15 LAB — LIPID PANEL W/O CHOL/HDL RATIO
Cholesterol, Total: 194 mg/dL (ref 100–199)
HDL: 59 mg/dL (ref >39)
LDL Chol Calc (NIH): 112 mg/dL — ABNORMAL HIGH (ref 0–99)
Triglycerides: 132 mg/dL (ref 0–149)
VLDL Cholesterol Cal: 23 mg/dL (ref 5–40)

## 2022-09-15 LAB — HEMOGLOBIN A1C
Est. average glucose Bld gHb Est-mCnc: 134 mg/dL
Hgb A1c MFr Bld: 6.3 % — ABNORMAL HIGH (ref 4.8–5.6)

## 2022-10-12 ENCOUNTER — Ambulatory Visit: Payer: PRIVATE HEALTH INSURANCE | Admitting: Internal Medicine

## 2022-11-26 ENCOUNTER — Other Ambulatory Visit: Payer: Self-pay

## 2022-11-26 ENCOUNTER — Emergency Department (HOSPITAL_BASED_OUTPATIENT_CLINIC_OR_DEPARTMENT_OTHER)
Admission: EM | Admit: 2022-11-26 | Discharge: 2022-11-26 | Disposition: A | Discharge status: Home / Self Care | Payer: BC Managed Care – PPO | Attending: Emergency Medicine | Admitting: Emergency Medicine

## 2022-11-26 ENCOUNTER — Emergency Department (HOSPITAL_BASED_OUTPATIENT_CLINIC_OR_DEPARTMENT_OTHER): Payer: BC Managed Care – PPO

## 2022-11-26 DIAGNOSIS — S6991XA Unspecified injury of right wrist, hand and finger(s), initial encounter: Secondary | ICD-10-CM | POA: Diagnosis present

## 2022-11-26 DIAGNOSIS — W540XXA Bitten by dog, initial encounter: Secondary | ICD-10-CM | POA: Diagnosis not present

## 2022-11-26 DIAGNOSIS — S61401A Unspecified open wound of right hand, initial encounter: Secondary | ICD-10-CM | POA: Diagnosis not present

## 2022-11-26 DIAGNOSIS — S61452A Open bite of left hand, initial encounter: Secondary | ICD-10-CM

## 2022-11-26 DIAGNOSIS — Y92009 Unspecified place in unspecified non-institutional (private) residence as the place of occurrence of the external cause: Secondary | ICD-10-CM | POA: Diagnosis not present

## 2022-11-26 MED ORDER — AMOXICILLIN-POT CLAVULANATE 875-125 MG PO TABS: 1.0000 | ORAL_TABLET | Freq: Two times a day (BID) | ORAL | 0 refills | Status: AC

## 2022-11-26 NOTE — ED Notes (Signed)
Hand soaking in NS/betadine mix.

## 2022-11-26 NOTE — Discharge Instructions (Addendum)
You were seen in the emergency department for an animal bite. Given the location of the bite and the size of these punctures, closure of these areas is not advised as it can induce an infection. I have sent antibiotics to your pharmacy to take for the next 7 days. Dr. Catha Brow with hand surgery also advised doing antibacterial soaks of the area with Dial Antibiotic soap for 10-15 minutes per day. His office will contact you to follow up within the next week to ensure the area is healing without any problems.

## 2022-11-26 NOTE — ED Provider Notes (Signed)
[place] EMERGENCY DEPARTMENT AT [facility] Provider Note   CSN: [id number] Arrival date & time: 11/26/22  1701     History Chief Complaint  Patient presents with   Animal Bite    Paige Watson is a 52 y.o. female.  Patient presents to the emergency department following an animal bite.  She reports that her home family dog bit her in the right hand.  Reports that the dog is currently up-to-date on rabies vaccination.  States that there is notable pain to the areas of punctures present.  No significant bleeding noted.  Some limitation in range of motion due to pain.   Animal Bite      Home Medications Prior to Admission medications   Medication Sig Start Date End Date Taking? Authorizing Provider  amoxicillin-clavulanate (AUGMENTIN) 875-125 MG tablet Take 1 tablet by mouth every 12 (twelve) hours for 7 days. 11/26/22 12/03/22 Yes Smitty Knudsen, PA-C  cyclobenzaprine (FLEXERIL) 10 MG tablet Take 1 tablet (10 mg total) by mouth 3 (three) times daily as needed for muscle spasms. 04/21/15   Geoffery Lyons, MD  enalapril-hydrochlorothiazide (VASERETIC) 10-25 MG tablet Take 1 tablet by mouth daily. 12/27/19   Vanetta Mulders, MD  Semaglutide,0.25 or 0.5MG /DOS, (OZEMPIC, 0.25 OR 0.5 MG/DOSE,) 2 MG/3ML SOPN INJECT 0.25MG  INTO THE SKIN ONE TIME PER WEEK 09/14/22   Margaretann Loveless, MD      Allergies    Pineapple, Doxycycline, and Kiwi extract    Review of Systems   Review of Systems  Skin:  Positive for wound.  All other systems reviewed and are negative.   Physical Exam Updated Vital Signs BP (!) 140/97 (BP Location: Left Arm)   Pulse 79   Temp 98.4 F (36.9 C) (Oral)   Resp 20   Ht 5\' 5"  (1.651 m)   Wt 106.6 kg   LMP 04/02/2015 (Within Days)   SpO2 98%   BMI 39.11 kg/m  Physical Exam Vitals and nursing note reviewed.  HENT:     Head: Normocephalic and atraumatic.  Eyes:     General: No scleral icterus.       Right eye: No discharge.        Left eye: No  discharge.  Cardiovascular:     Rate and Rhythm: Normal rate and regular rhythm.  Musculoskeletal:        General: Tenderness present. Normal range of motion.  Skin:    General: Skin is warm and dry.     Findings: Lesion present. No rash.     Comments: See pictures for reference of wounds to the right hand.     ED Results / Procedures / Treatments   Labs (all labs ordered are listed, but only abnormal results are displayed) Labs Reviewed - No data to display  EKG None  Radiology DG Hand Complete Right  Result Date: 11/26/2022 CLINICAL DATA:  Dog bite. EXAM: RIGHT HAND - COMPLETE 3+ VIEW COMPARISON:  None Available. FINDINGS: There is no evidence of fracture or dislocation. There is no evidence of arthropathy or other focal bone abnormality. No radiopaque foreign body. IMPRESSION: Negative. Electronically Signed   By: Emmaline Kluver M.D.   On: 11/26/2022 18:22    Procedures Procedures   Medications Ordered in ED Medications - No data to display  ED Course/ Medical Decision Making/ A&P                           Medical Decision Making  Amount and/or Complexity of Data Reviewed Radiology: ordered.  Risk Prescription drug management.   This patient presents to the ED for concern of animal bite.  Differential diagnosis includes dog bite, rabies prophylaxis, flexor tenosynovitis   Imaging Studies ordered:  I ordered imaging studies including xray of right hand I independently visualized and interpreted imaging which showed no obvious fractures, dislocations, retained foreign objects I agree with the radiologist interpretation   Problem List / ED Course:  Patient presents to the emergency department complaints of an animal bite.  She reports that the family dog bit her in the right hand.  Dog is up-to-date on rabies vaccination.  Having pain in the bit area with movement.  No obvious drainage coming from the site be on blood.  No significant erythema at the site either  but some notable swelling has begun. X-ray imaging performed to evaluate area.  No obvious abnormalities noted on imaging such as fractures, dislocations, retained foreign objects.  Wash the area out and cleanse the area using chlorhexidine and saline.  No foreign objects were retrieved.  Difficulty appreciate any obvious injury to any tendons of the hand.  Will consult with hand surgery. Spoke with Dr. Catha Brow who advised the patient is safe for outpatient management of condition.  Encourage patient to take oral antibiotics as prescribed.  Also advised using antibacterial soap rinse daily to reduce risk of infection. Dr. Ceasar Mons office will reach out to patient and have patient evaluated within the next week. Informed patient of recommendations from hand surgery.  Patient is agreeable with treatment plan verbalized understanding all return precautions.  All questions answered prior to patient discharge.   Final Clinical Impression(s) / ED Diagnoses Final diagnoses:  Open wound of left hand due to dog bite    Rx / DC Orders ED Discharge Orders          Ordered    amoxicillin-clavulanate (AUGMENTIN) 875-125 MG tablet  Every 12 hours        11/26/22 1827              Smitty Knudsen, PA-C 11/26/22 Ishmael Holter, MD 11/28/22 0001

## 2022-11-26 NOTE — ED Triage Notes (Signed)
Pt POV from home, caox4, ambulatory reporting she was bit on L hand by family member's dog PTA. Pt reports dog is up to date on rabies vaccines. Bleeding controlled and hand wrapped PTA. Pt denies any other injury.

## 2022-11-26 NOTE — ED Notes (Signed)
Lt hand dressed with gauze and wrapped with Kerlix.  Instruction give on wound care.  Education on signs of infection. Gave verbal indication understood such

## 2022-11-27 ENCOUNTER — Telehealth: Payer: Self-pay | Admitting: Internal Medicine

## 2022-11-27 NOTE — Telephone Encounter (Signed)
Entered in error

## 2022-12-11 ENCOUNTER — Encounter: Payer: Self-pay | Admitting: Neurology

## 2022-12-11 ENCOUNTER — Emergency Department (HOSPITAL_BASED_OUTPATIENT_CLINIC_OR_DEPARTMENT_OTHER): Payer: BC Managed Care – PPO

## 2022-12-11 ENCOUNTER — Other Ambulatory Visit (HOSPITAL_BASED_OUTPATIENT_CLINIC_OR_DEPARTMENT_OTHER): Payer: Self-pay

## 2022-12-11 ENCOUNTER — Encounter (HOSPITAL_BASED_OUTPATIENT_CLINIC_OR_DEPARTMENT_OTHER): Payer: Self-pay | Admitting: Emergency Medicine

## 2022-12-11 ENCOUNTER — Emergency Department (HOSPITAL_BASED_OUTPATIENT_CLINIC_OR_DEPARTMENT_OTHER)
Admission: EM | Admit: 2022-12-11 | Discharge: 2022-12-11 | Disposition: A | Discharge status: Home / Self Care | Payer: BC Managed Care – PPO | Attending: Emergency Medicine | Admitting: Emergency Medicine

## 2022-12-11 ENCOUNTER — Other Ambulatory Visit: Payer: Self-pay

## 2022-12-11 DIAGNOSIS — G510 Bell's palsy: Secondary | ICD-10-CM | POA: Diagnosis not present

## 2022-12-11 DIAGNOSIS — Z7982 Long term (current) use of aspirin: Secondary | ICD-10-CM | POA: Insufficient documentation

## 2022-12-11 DIAGNOSIS — I6990 Unspecified sequelae of unspecified cerebrovascular disease: Secondary | ICD-10-CM | POA: Diagnosis not present

## 2022-12-11 DIAGNOSIS — R29810 Facial weakness: Secondary | ICD-10-CM | POA: Diagnosis present

## 2022-12-11 DIAGNOSIS — R930 Abnormal findings on diagnostic imaging of skull and head, not elsewhere classified: Secondary | ICD-10-CM | POA: Insufficient documentation

## 2022-12-11 DIAGNOSIS — Z8673 Personal history of transient ischemic attack (TIA), and cerebral infarction without residual deficits: Secondary | ICD-10-CM

## 2022-12-11 LAB — CBC WITH DIFFERENTIAL/PLATELET
Abs Immature Granulocytes: 0.01 10*3/uL (ref 0.00–0.07)
Basophils Absolute: 0 10*3/uL (ref 0.0–0.1)
Basophils Relative: 1 %
Eosinophils Absolute: 0.1 10*3/uL (ref 0.0–0.5)
Eosinophils Relative: 2 %
HCT: 40.4 % (ref 36.0–46.0)
Hemoglobin: 13.6 g/dL (ref 12.0–15.0)
Immature Granulocytes: 0 %
Lymphocytes Relative: 44 %
Lymphs Abs: 2.1 10*3/uL (ref 0.7–4.0)
MCH: 30.5 pg (ref 26.0–34.0)
MCHC: 33.7 g/dL (ref 30.0–36.0)
MCV: 90.6 fL (ref 80.0–100.0)
Monocytes Absolute: 0.5 10*3/uL (ref 0.1–1.0)
Monocytes Relative: 10 %
Neutro Abs: 2 10*3/uL (ref 1.7–7.7)
Neutrophils Relative %: 43 %
Platelets: 224 10*3/uL (ref 150–400)
RBC: 4.46 MIL/uL (ref 3.87–5.11)
RDW: 12.4 % (ref 11.5–15.5)
WBC: 4.7 10*3/uL (ref 4.0–10.5)
nRBC: 0 % (ref 0.0–0.2)

## 2022-12-11 LAB — BASIC METABOLIC PANEL
Anion gap: 6 (ref 5–15)
BUN: 11 mg/dL (ref 6–20)
CO2: 29 mmol/L (ref 22–32)
Calcium: 9 mg/dL (ref 8.9–10.3)
Chloride: 103 mmol/L (ref 98–111)
Creatinine, Ser: 0.72 mg/dL (ref 0.44–1.00)
GFR, Estimated: >60 mL/min (ref >60)
Glucose, Bld: 106 mg/dL — ABNORMAL HIGH (ref 70–99)
Potassium: 3.7 mmol/L (ref 3.5–5.1)
Sodium: 138 mmol/L (ref 135–145)

## 2022-12-11 MED ORDER — VALACYCLOVIR HCL 1 G PO TABS: 1000.0000 mg | ORAL_TABLET | Freq: Three times a day (TID) | ORAL | 0 refills | Status: AC

## 2022-12-11 MED ORDER — PREDNISONE 20 MG PO TABS: 60.0000 mg | ORAL_TABLET | Freq: Every day | ORAL | 0 refills | Status: DC

## 2022-12-11 NOTE — ED Provider Notes (Signed)
[place] EMERGENCY DEPARTMENT AT [facility] Provider Note   CSN: [id number] Arrival date & time: 12/11/22  1217     History  Chief Complaint  Patient presents with   Facial Droop    Paige Watson is a 52 y.o. female.  Patient presents with left-sided facial droop started yesterday around 7 in the morning.  Patient was sent over this afternoon after seeing eye doctor because she was unable to close left eye.  Eye doctor gave eyedrops and sent to the ER for stroke workup.  Patient feels vague left leg weakness and also in the arm.  No significant deficit, can walk/ambulate and move without difficulty.  No fevers or chills.  No double vision or focal loss of vision.  Family history of Bell's palsy.       Home Medications Prior to Admission medications   Medication Sig Start Date End Date Taking? Authorizing Provider  predniSONE (DELTASONE) 20 MG tablet Take 3 tablets (60 mg total) by mouth daily with breakfast. 12/11/22  Yes Blane Ohara, MD  valACYclovir (VALTREX) 1000 MG tablet Take 1 tablet (1,000 mg total) by mouth 3 (three) times daily for 7 days. 12/11/22 12/18/22 Yes Blane Ohara, MD  cyclobenzaprine (FLEXERIL) 10 MG tablet Take 1 tablet (10 mg total) by mouth 3 (three) times daily as needed for muscle spasms. 04/21/15   Geoffery Lyons, MD  enalapril-hydrochlorothiazide (VASERETIC) 10-25 MG tablet Take 1 tablet by mouth daily. 12/27/19   Vanetta Mulders, MD  Semaglutide,0.25 or 0.5MG /DOS, (OZEMPIC, 0.25 OR 0.5 MG/DOSE,) 2 MG/3ML SOPN INJECT 0.25MG  INTO THE SKIN ONE TIME PER WEEK 09/14/22   Margaretann Loveless, MD      Allergies    Pineapple, Pineapple extract, Doxycycline, and Kiwi extract    Review of Systems   Review of Systems  Constitutional:  Negative for chills and fever.  HENT:  Negative for congestion.   Eyes:  Negative for visual disturbance.  Respiratory:  Negative for shortness of breath.   Cardiovascular:  Negative for chest pain.  Gastrointestinal:   Negative for abdominal pain and vomiting.  Genitourinary:  Negative for dysuria and flank pain.  Musculoskeletal:  Negative for back pain, neck pain and neck stiffness.  Skin:  Negative for rash.  Neurological:  Positive for weakness. Negative for light-headedness and headaches.    Physical Exam Updated Vital Signs BP (!) 155/89   Pulse 66   Temp 98.1 F (36.7 C) (Oral)   Resp 16   Ht 5\' 5"  (1.651 m)   Wt 110.2 kg   LMP 04/02/2015 (Within Days)   SpO2 97%   BMI 40.44 kg/m  Physical Exam Vitals and nursing note reviewed.  Constitutional:      General: She is not in acute distress.    Appearance: She is well-developed.  HENT:     Head: Normocephalic and atraumatic.     Mouth/Throat:     Mouth: Mucous membranes are moist.  Eyes:     General:        Right eye: No discharge.        Left eye: No discharge.     Conjunctiva/sclera: Conjunctivae normal.  Neck:     Trachea: No tracheal deviation.  Cardiovascular:     Rate and Rhythm: Normal rate and regular rhythm.  Pulmonary:     Effort: Pulmonary effort is normal.  Abdominal:     General: There is no distension.     Palpations: Abdomen is soft.     Tenderness: There is  no abdominal tenderness. There is no guarding.  Musculoskeletal:     Cervical back: Normal range of motion and neck supple. No rigidity.  Skin:    General: Skin is warm.     Capillary Refill: Capillary refill takes less than 2 seconds.     Findings: No rash.  Neurological:     General: No focal deficit present.     Mental Status: She is alert.     Cranial Nerves: Cranial nerve deficit present.     Comments: Patient has notable left facial droop and weakness with smiling and raising eyebrow.  Unable to fully close left eye.  Decree sensation left face versus right.  Patient has equal strength all extremities bilateral however patient feels slightly different on the left leg.  Sensation intact to palpation all extremities.  Extraocular muscle function  intact.  Psychiatric:        Mood and Affect: Mood normal.     ED Results / Procedures / Treatments   Labs (all labs ordered are listed, but only abnormal results are displayed) Labs Reviewed  BASIC METABOLIC PANEL - Abnormal; Notable for the following components:      Result Value   Glucose, Bld 106 (*)    All other components within normal limits  CBC WITH DIFFERENTIAL/PLATELET    EKG EKG Interpretation  Date/Time:  Friday December 11 2022 12:28:52 EDT Ventricular Rate:  70 PR Interval:  161 QRS Duration: 92 QT Interval:  412 QTC Calculation: 445 R Axis:   67 Text Interpretation: Sinus rhythm Confirmed by Blane Ohara [phone]) on 12/11/2022 12:34:33 PM  Radiology MR BRAIN WO CONTRAST  Result Date: 12/11/2022 CLINICAL DATA:  Provided history: Facial paralysis/weakness (CN 7). Left facial droop. Leg symptoms. EXAM: MRI HEAD WITHOUT CONTRAST TECHNIQUE: Multiplanar, multiecho pulse sequences of the brain and surrounding structures were obtained without intravenous contrast. COMPARISON:  Head CT 12/26/2019. FINDINGS: Brain: No age advanced or lobar predominant parenchymal atrophy. Chronic lacunar infarct centered within the left lentiform nucleus and genu/anterior limb of left internal capsule. There is no acute infarct. No evidence of an intracranial mass. No chronic intracranial blood products. No extra-axial fluid collection. No midline shift. Vascular: Maintained flow voids within the proximal large arterial vessels. Skull and upper cervical spine: No focal suspicious marrow lesion. Incompletely assessed cervical spondylosis. Sinuses/Orbits: No mass or acute finding within the imaged orbits. No significant paranasal sinus disease. IMPRESSION: 1. No evidence of an acute intracranial abnormality on this non-contrast exam. 2. Chronic lacunar infarct centered within the left lentiform nucleus and genu/anterior limb of left internal capsule. Electronically Signed   By: Jackey Loge D.O.   On:  12/11/2022 13:31    Procedures Procedures    Medications Ordered in ED Medications - No data to display  ED Course/ Medical Decision Making/ A&P                             Medical Decision Making Amount and/or Complexity of Data Reviewed Labs: ordered. Radiology: ordered.  Risk Prescription drug management.   Patient presents for concern for neurologic cause of pathology that started yesterday at 7 in the morning.  Discussed differential primarily concerning for Bell's palsy given left upper and lower facial weakness.  Patient has minimal subjective symptoms in the left leg.  MRI ordered for further delineation and discussed plan for antiviral and steroids with close outpatient follow-up.  Patient already was recommended eyedrops by doctor.  Patient and significant  other comfortable plan.  Basic blood work pending, later reviewed independently and no acute abnormalities normal hemoglobin, electrolytes unremarkable.  CT scan results independently reviewed and shared with patient showing old lacunar stroke no bleeding, no acute stroke.  Discussed treatment for Bell's palsy and close follow-up with primary doctor and neurology for further stroke workup including carotid/echo.  Discussed baby aspirin.  Patient comfortable with plan.  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

## 2022-12-11 NOTE — Discharge Instructions (Addendum)
Take steroids and antivirals as recommended. Use eye drops for your left eye. It is very important for you to see your primary doctor and neurology for stroke workup and have an ultrasound done of your heart and ultrasound of your carotid arteries.  Discussed taking aspirin with your primary doctor.  Have your blood pressure controlled will decrease the risk of stroke. Return for new weakness, numbness, speech difficulty or new concerns.

## 2022-12-11 NOTE — ED Notes (Signed)
Discharge paperwork given and verbally understood. 

## 2022-12-11 NOTE — ED Triage Notes (Signed)
Pt arrives to ED with c/o left sided facial droop that started yesterday around 7am. Pt notes inability to close left eye. She does notes minimal left leg weakness and left forearm tightness.

## 2022-12-14 ENCOUNTER — Ambulatory Visit (INDEPENDENT_AMBULATORY_CARE_PROVIDER_SITE_OTHER): Payer: PRIVATE HEALTH INSURANCE | Admitting: Internal Medicine

## 2022-12-14 ENCOUNTER — Encounter: Payer: Self-pay | Admitting: Internal Medicine

## 2022-12-14 VITALS — BP 130/80 | HR 87 | Ht 65.0 in | Wt 241.0 lb

## 2022-12-14 DIAGNOSIS — G510 Bell's palsy: Secondary | ICD-10-CM

## 2022-12-14 DIAGNOSIS — E782 Mixed hyperlipidemia: Secondary | ICD-10-CM

## 2022-12-14 DIAGNOSIS — G4733 Obstructive sleep apnea (adult) (pediatric): Secondary | ICD-10-CM

## 2022-12-14 DIAGNOSIS — I10 Essential (primary) hypertension: Secondary | ICD-10-CM

## 2022-12-14 NOTE — Progress Notes (Signed)
Established Patient Office Visit  Subjective:  Patient ID: Paige Watson, female    DOB: [birth year]  Age: 52 y.o. MRN: [id number]  Chief Complaint  Patient presents with   Follow-up    Hospital follow up-stroke and bells palsy    Patient in office for ED follow up. Patient presented to the ED on 12/11/22 complaining of  left-sided facial droop that started the day prior around 7 in the morning.  Patient was seen by her eye doctor because she was unable to close left eye.  Eye doctor gave eyedrops and sent her to the ED for stroke workup.  Patient felt vague left leg weakness and also in the arm.  No significant deficit, was able to walk/ambulate and move without difficulty.  No fevers or chills.  No double vision or focal loss of vision.  Family history of Bell's palsy in her sister. Patient was subsequently diagnosed with Bell's palsy, prescribed prednisone and Valtrex and sent home.  MRI performed while the patient was in the ED revealed an old chronic lacunar infarct. Patient presents today for follow up. Somewhat improved . Patient unable to close her left eye completely. Left side of face has a mild droop.  Has an appointment to see Neurology.     No other concerns at this time.   Past Medical History:  Diagnosis Date   Anemia    Diastolic dysfunction    Endometriosis    Hypertension    Vitamin D deficiency     Past Surgical History:  Procedure Laterality Date   CESAREAN SECTION     CHOLECYSTECTOMY     TUBAL LIGATION      Social History   Socioeconomic History   Marital status: Married    Spouse name: Not on file   Number of children: Not on file   Years of education: Not on file   Highest education level: Not on file  Occupational History   Not on file  Tobacco Use   Smoking status: Never   Smokeless tobacco: Never  Substance and Sexual Activity   Alcohol use: Yes    Comment: occ   Drug use: No   Sexual activity: Not on file  Other Topics Concern    Not on file  Social History Narrative   Not on file   Social Determinants of Health   Financial Resource Strain: Not on file  Food Insecurity: Not on file  Transportation Needs: Not on file  Physical Activity: Not on file  Stress: Not on file  Social Connections: Not on file  Intimate Partner Violence: Not on file    Family History  Problem Relation Age of Onset   Hypertension Mother     Allergies  Allergen Reactions   Pineapple Anaphylaxis, Itching and Swelling    Other reaction(s): Swelling    Pineapple Extract Anaphylaxis   Doxycycline     nausea   Kiwi Extract Swelling    Other reaction(s): Swelling     Review of Systems  Constitutional: Negative.  Negative for diaphoresis, fever and malaise/fatigue.  HENT: Negative.  Negative for hearing loss.   Eyes:  Positive for redness. Negative for blurred vision, double vision, photophobia and discharge.       Unable to fully close left eye  Respiratory: Negative.  Negative for cough and shortness of breath.   Cardiovascular: Negative.  Negative for chest pain, palpitations, leg swelling and PND.  Gastrointestinal: Negative.  Negative for abdominal pain, constipation, diarrhea, heartburn, nausea and vomiting.  Genitourinary: Negative.  Negative for dysuria.  Musculoskeletal: Negative.  Negative for back pain, falls and neck pain.  Skin: Negative.   Neurological:  Negative for dizziness, seizures, weakness and headaches.  Endo/Heme/Allergies: Negative.   Psychiatric/Behavioral: Negative.  Negative for depression. The patient does not have insomnia.        Objective:   BP 130/80   Pulse 87   Ht 5\' 5"  (1.651 m)   Wt 241 lb (109.3 kg)   LMP 04/02/2015 (Within Days)   SpO2 96%   BMI 40.10 kg/m   Vitals:   12/14/22 1500  BP: 130/80  Pulse: 87  Height: 5\' 5"  (1.651 m)  Weight: 241 lb (109.3 kg)  SpO2: 96%  BMI (Calculated): 40.1    Physical Exam Vitals and nursing note reviewed.  Constitutional:       Appearance: Normal appearance.  HENT:     Head: Normocephalic and atraumatic.     Nose: Nose normal.  Eyes:     Conjunctiva/sclera: Conjunctivae normal.  Cardiovascular:     Rate and Rhythm: Normal rate and regular rhythm.     Heart sounds: Normal heart sounds.  Pulmonary:     Effort: Pulmonary effort is normal.     Breath sounds: Normal breath sounds.  Abdominal:     Palpations: Abdomen is soft.  Musculoskeletal:        General: Normal range of motion.     Cervical back: Normal range of motion.     Right lower leg: No edema.     Left lower leg: No edema.  Skin:    General: Skin is warm and dry.     Findings: No lesion.  Neurological:     General: No focal deficit present.     Mental Status: She is alert and oriented to person, place, and time.     Motor: Weakness (Left facial droop) present.  Psychiatric:        Mood and Affect: Mood normal.        Behavior: Behavior normal.      No results found for any visits on 12/14/22.  Recent Results (from the past 2160 hour(s))  CBC with Differential     Status: None   Collection Time: 12/11/22 12:33 PM  Result Value Ref Range   WBC 4.7 4.0 - 10.5 K/uL   RBC 4.46 3.87 - 5.11 MIL/uL   Hemoglobin 13.6 12.0 - 15.0 g/dL   HCT 16.1 09.6 - 04.5 %   MCV 90.6 80.0 - 100.0 fL   MCH 30.5 26.0 - 34.0 pg   MCHC 33.7 30.0 - 36.0 g/dL   RDW 40.9 81.1 - 91.4 %   Platelets 224 150 - 400 K/uL   nRBC 0.0 0.0 - 0.2 %   Neutrophils Relative % 43 %   Neutro Abs 2.0 1.7 - 7.7 K/uL   Lymphocytes Relative 44 %   Lymphs Abs 2.1 0.7 - 4.0 K/uL   Monocytes Relative 10 %   Monocytes Absolute 0.5 0.1 - 1.0 K/uL   Eosinophils Relative 2 %   Eosinophils Absolute 0.1 0.0 - 0.5 K/uL   Basophils Relative 1 %   Basophils Absolute 0.0 0.0 - 0.1 K/uL   Immature Granulocytes 0 %   Abs Immature Granulocytes 0.01 0.00 - 0.07 K/uL    Comment: Performed at [facility], [address], [place], [place] [address]  Basic metabolic panel      Status: Abnormal   Collection Time: 12/11/22 12:33 PM  Result Value Ref  Range   Sodium 138 135 - 145 mmol/L   Potassium 3.7 3.5 - 5.1 mmol/L   Chloride 103 98 - 111 mmol/L   CO2 29 22 - 32 mmol/L   Glucose, Bld 106 (H) 70 - 99 mg/dL    Comment: Glucose reference range applies only to samples taken after fasting for at least 8 hours.   BUN 11 6 - 20 mg/dL   Creatinine, Ser 9.14 0.44 - 1.00 mg/dL   Calcium 9.0 8.9 - 78.2 mg/dL   GFR, Estimated >95 >62 mL/min    Comment: (NOTE) Calculated using the CKD-EPI Creatinine Equation (2021)    Anion gap 6 5 - 15    Comment: Performed at [facility], [address] [place], [place] [address]      Assessment & Plan:  Continue prednisone and Valtrex as prescribed. Return in 4 weeks after trip to [place]. Call the office with worsening symptoms.   Problem List Items Addressed This Visit     Essential hypertension, benign   OSA (obstructive sleep apnea)   Bell's palsy - Primary   Mixed hyperlipidemia    Return in about 4 weeks (around 01/11/2023).   Total time spent: 30 minutes  Margaretann Loveless, MD  12/14/2022   This document may have been prepared by [facility] Voice Recognition software and as such may include unintentional dictation errors.

## 2022-12-22 ENCOUNTER — Ambulatory Visit (INDEPENDENT_AMBULATORY_CARE_PROVIDER_SITE_OTHER): Payer: BC Managed Care – PPO | Admitting: Neurology

## 2022-12-22 ENCOUNTER — Encounter: Payer: Self-pay | Admitting: Neurology

## 2022-12-22 ENCOUNTER — Other Ambulatory Visit: Payer: Self-pay | Admitting: Neurology

## 2022-12-22 VITALS — BP 137/77 | HR 76 | Ht 65.0 in | Wt 241.8 lb

## 2022-12-22 DIAGNOSIS — G510 Bell's palsy: Secondary | ICD-10-CM

## 2022-12-22 DIAGNOSIS — Z8673 Personal history of transient ischemic attack (TIA), and cerebral infarction without residual deficits: Secondary | ICD-10-CM

## 2022-12-22 DIAGNOSIS — I679 Cerebrovascular disease, unspecified: Secondary | ICD-10-CM

## 2022-12-22 MED ORDER — ATORVASTATIN CALCIUM 10 MG PO TABS: 10.0000 mg | ORAL_TABLET | Freq: Every day | ORAL | 0 refills | Status: DC

## 2022-12-22 NOTE — Progress Notes (Signed)
[facility] Clinic Note - Initial Visit   Date: 12/22/2022   Paige Watson MRN: [id number] DOB: [birth year]   Dear Dr. Jodi Mourning:  Thank you for your kind referral of Paige Watson for consultation of Bell's palsy. Although her history is well known to you, please allow [place] to reiterate it for the purpose of our medical record. The patient was accompanied to the clinic by self.     Paige Watson is a 52 y.o. left-handed female with hypertension, CPAP on OSA, and prediabetes presenting for evaluation of Bell's palsy and old stroke.   IMPRESSION/PLAN: Left Bell's palsy, improving.  She has mild residual weakness with orbicularis oris, buccinator, and orbicularis oculi.  Discussed that she has had marked improvement over a short period of time and I suspect that she will continue to see this over the coming weeks to months.  Recovery can take up to 6-12 months.  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  Patient denies history of right sided weakness, paresthesias, or dysarthria.  Etiology most likely small vessel disease based on location.  To minimize risk of future events, I recommend that she work with PCP for secondary risk factor modification by optimizing diabetes and cholesterol management (LDL 112). I will start her on low dose lipitor 10mg  daily and provided 30-day supply.  Recommend heart healthy diet and starting exercise regimen.  She is scheduled to see her PCP later this month and have requested that PCP continue to manage this.  I also recommend that she continue aspirin 81mg  daily.  Return to clinic in 6 months  ------------------------------------------------------------- History of present illness: She reports having left facial weakness on 6/19.  She went to her eye doctor who noticed left eyelid droopiness and facial weakness who suggested to go to the ER.  She went to the ER on 6/21 where MRI brain was performed because  she was also complaining of left hand weakness.  MRI brain shows no acute abnormalities, but did not remote lacunar infarct involving the left internal capsule.  She was given prednisone and valcyclovir upon discharge.  Over the past week, she has noticed that facial weakness has improved and she is able to close her left eye and cheek movements are better.  Her face is not getting pulled to the right side as much as it previously was.  She started a aspirin 81mg  for secondary prevention of stroke.  She does not smoke.   Out-side paper records, electronic medical record, and images have been reviewed where available and summarized as:  MRI brain wo contrast 12/11/2022: 1. No evidence of an acute intracranial abnormality on this non-contrast exam. 2. Chronic lacunar infarct centered within the left lentiform nucleus and genu/anterior limb of left internal capsule.  Lab Results  Component Value Date   HGBA1C 6.3 (H) 09/14/2022   Lab Results  Component Value Date   CHOL 194 09/14/2022   HDL 59 09/14/2022   LDLCALC 112 (H) 09/14/2022   TRIG 132 09/14/2022     Past Medical History:  Diagnosis Date   Anemia    Diastolic dysfunction    Endometriosis    Hypertension    Vitamin D deficiency     Past Surgical History:  Procedure Laterality Date   CESAREAN SECTION     CHOLECYSTECTOMY     TUBAL LIGATION       Medications:  Outpatient Encounter Medications as of 12/22/2022  Medication Sig   aspirin EC 81 MG tablet  Take 81 mg by mouth daily. Swallow whole.   cyclobenzaprine (FLEXERIL) 10 MG tablet Take 1 tablet (10 mg total) by mouth 3 (three) times daily as needed for muscle spasms.   enalapril-hydrochlorothiazide (VASERETIC) 10-25 MG tablet Take 1 tablet by mouth daily.   [DISCONTINUED] predniSONE (DELTASONE) 20 MG tablet Take 3 tablets (60 mg total) by mouth daily with breakfast.   No facility-administered encounter medications on file as of 12/22/2022.    Allergies:  Allergies   Allergen Reactions   Pineapple Anaphylaxis, Itching and Swelling    Other reaction(s): Swelling    Pineapple Extract Anaphylaxis   Doxycycline     nausea   Kiwi Extract Swelling    Other reaction(s): Swelling     Family History: Family History  Problem Relation Age of Onset   Hypertension Mother     Social History: Social History   Tobacco Use   Smoking status: Former    Types: Cigarettes   Smokeless tobacco: Never  Vaping Use   Vaping Use: Never used  Substance Use Topics   Alcohol use: Yes    Comment: occ   Drug use: No   Social History   Social History Narrative   Are you right handed or left handed? Left handed    Are you currently employed ? 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 No    Who lives with you? Husband    What type of home do you live in: 1 story or 2 story? [address]ses [address]         Vital Signs:  BP 137/77   Pulse 76   Ht 5\' 5"  (1.651 m)   Wt 241 lb 12.8 oz (109.7 kg)   LMP 04/02/2015 (Within Days)   SpO2 96%   BMI 40.24 kg/m   Neurological Exam: MENTAL STATUS including orientation to time, place, person, recent and remote memory, attention span and concentration, language, and fund of knowledge is normal.  Speech is not dysarthric.  CRANIAL NERVES: II:  No visual field defects.     III-IV-VI: Pupils equal round and reactive to light.  Normal conjugate, extra-ocular eye movements in all directions of gaze.  No nystagmus.  Trace left ptosis.   V:  Normal facial sensation.    VII:  There is mild facial asymmetry and movements.  Orbicularis oculi, buccinator, and orbicularis oris is 4/5 VIII:  Normal hearing and vestibular function.   IX-X:  Normal palatal movement.   XI:  Normal shoulder shrug and head rotation.   XII:  Normal tongue strength and range of motion, no deviation or fasciculation.  MOTOR:  No atrophy, fasciculations or abnormal movements.  No pronator drift.   Upper Extremity:   Right  Left  Deltoid  5/5   5/5   Biceps  5/5   5/5   Triceps  5/5   5/5   Wrist extensors  5/5   5/5   Wrist flexors  5/5   5/5   Finger extensors  5/5   5/5   Finger flexors  5/5   5/5   Dorsal interossei  5/5   5/5   Tone (Ashworth scale)  0  0   Lower Extremity:  Right  Left  Hip flexors  5/5   5/5   Knee flexors  5/5   5/5   Knee extensors  5/5   5/5   Dorsiflexors  5/5   5/5  Plantarflexors  5/5   5/5   Toe extensors  5/5   5/5   Toe flexors  5/5   5/5   Tone (Ashworth scale)  0  0   MSRs:                                           Right        Left brachioradialis 2+  2+  biceps 2+  2+  triceps 2+  2+  patellar 2+  2+  ankle jerk 2+  2+  plantar response down  down   SENSORY:  Normal and symmetric perception of light touch, vibration, and temperature.    COORDINATION/GAIT: Normal finger-to- nose-finger.  Intact rapid alternating movements bilaterally.   Gait narrow based and stable.   Total time spent reviewing records, interview, history/exam, documentation, and coordination of care on day of encounter:  60 min   Thank you for allowing me to participate in patient's care.  If I can answer any additional questions, I would be pleased to do so.    Sincerely,    Ailsa Mireles K. Allena Katz, DO

## 2022-12-22 NOTE — Patient Instructions (Addendum)
Start lipitor 10mg  daily  Continue aspirin 81mg  daily  Return to clinic in 6 month

## 2022-12-23 ENCOUNTER — Other Ambulatory Visit: Payer: Self-pay | Admitting: Neurology

## 2022-12-25 ENCOUNTER — Other Ambulatory Visit: Payer: Self-pay | Admitting: Neurology

## 2023-01-06 DIAGNOSIS — N840 Polyp of corpus uteri: Secondary | ICD-10-CM | POA: Insufficient documentation

## 2023-01-06 DIAGNOSIS — N950 Postmenopausal bleeding: Secondary | ICD-10-CM | POA: Insufficient documentation

## 2023-01-08 DIAGNOSIS — D509 Iron deficiency anemia, unspecified: Secondary | ICD-10-CM | POA: Insufficient documentation

## 2023-01-14 ENCOUNTER — Ambulatory Visit: Payer: PRIVATE HEALTH INSURANCE | Admitting: Cardiology

## 2023-01-29 ENCOUNTER — Ambulatory Visit: Payer: PRIVATE HEALTH INSURANCE | Admitting: Internal Medicine

## 2023-04-07 ENCOUNTER — Telehealth: Payer: Self-pay | Admitting: Internal Medicine

## 2023-04-07 ENCOUNTER — Other Ambulatory Visit: Payer: Self-pay | Admitting: Internal Medicine

## 2023-04-07 DIAGNOSIS — I10 Essential (primary) hypertension: Secondary | ICD-10-CM

## 2023-04-07 DIAGNOSIS — E782 Mixed hyperlipidemia: Secondary | ICD-10-CM

## 2023-04-07 DIAGNOSIS — R7302 Impaired glucose tolerance (oral): Secondary | ICD-10-CM

## 2023-04-07 NOTE — Telephone Encounter (Signed)
Patient is wanting to go get her basic labs done but needs orders in. Can you place basic lab orders please?

## 2023-04-09 ENCOUNTER — Ambulatory Visit: Payer: PRIVATE HEALTH INSURANCE | Admitting: Internal Medicine

## 2023-04-09 ENCOUNTER — Encounter: Payer: Self-pay | Admitting: Internal Medicine

## 2023-04-09 ENCOUNTER — Other Ambulatory Visit: Payer: Self-pay | Admitting: Internal Medicine

## 2023-04-09 ENCOUNTER — Ambulatory Visit (INDEPENDENT_AMBULATORY_CARE_PROVIDER_SITE_OTHER): Payer: PRIVATE HEALTH INSURANCE | Admitting: Internal Medicine

## 2023-04-09 VITALS — BP 140/88 | HR 70 | Ht 65.0 in | Wt 244.2 lb

## 2023-04-09 DIAGNOSIS — E1169 Type 2 diabetes mellitus with other specified complication: Secondary | ICD-10-CM

## 2023-04-09 DIAGNOSIS — I10 Essential (primary) hypertension: Secondary | ICD-10-CM | POA: Diagnosis not present

## 2023-04-09 DIAGNOSIS — E1159 Type 2 diabetes mellitus with other circulatory complications: Secondary | ICD-10-CM | POA: Diagnosis not present

## 2023-04-09 DIAGNOSIS — F411 Generalized anxiety disorder: Secondary | ICD-10-CM | POA: Diagnosis not present

## 2023-04-09 DIAGNOSIS — E782 Mixed hyperlipidemia: Secondary | ICD-10-CM

## 2023-04-09 DIAGNOSIS — I152 Hypertension secondary to endocrine disorders: Secondary | ICD-10-CM

## 2023-04-09 MED ORDER — FREESTYLE LIBRE 3 PLUS SENSOR MISC
Status: DC
Start: 2023-04-09 — End: 2023-04-09

## 2023-04-09 MED ORDER — FREESTYLE LIBRE 3 READER DEVI: 1.0000 | Freq: Every day | 3 refills | Status: AC

## 2023-04-09 MED ORDER — SIMVASTATIN 20 MG PO TABS: 20.0000 mg | ORAL_TABLET | Freq: Every day | ORAL | 0 refills | Status: AC

## 2023-04-09 MED ORDER — FREESTYLE LIBRE 3 PLUS SENSOR MISC
6 refills | Status: DC
Start: 2023-04-09 — End: 2023-09-23

## 2023-04-09 MED ORDER — FREESTYLE LIBRE 3 READER DEVI
1.0000 | Freq: Every day | Status: DC
Start: 2023-04-09 — End: 2023-04-09

## 2023-04-09 MED ORDER — METFORMIN HCL 500 MG PO TABS: 500.0000 mg | ORAL_TABLET | Freq: Two times a day (BID) | ORAL | 3 refills | Status: DC

## 2023-04-09 NOTE — Progress Notes (Signed)
Established Patient Office Visit  Subjective:  Patient ID: Paige Watson, female    DOB: 1971-01-30  Age: 52 y.o. MRN: [id number]  Chief Complaint  Patient presents with   Follow-up    Follow up    Patient is here for her follow-up.  She recently had labs done at [facility] labs which showed her hemoglobin A1c is now 7.9.  Reports of polyuria, polyphagia and fatigue. Previously it  was 6.3 in March of this year. Patient admits to dietary indiscretion and not avoiding carbs over the last few months. Her blood pressure is high today as well as she is suffering from a muscle cramp in her shoulder. Denies headaches or dizziness, no nausea vomiting, no chest pain and no palpitations. Will start with metformin 500 mg twice a day.  As well as starting a strict diabetic diet control. Will consider dietary consultation. Her Bell's palsy has resolved completely.    No other concerns at this time.   Past Medical History:  Diagnosis Date   Anemia    Diastolic dysfunction    Endometriosis    Hypertension    Vitamin D deficiency     Past Surgical History:  Procedure Laterality Date   CESAREAN SECTION     CHOLECYSTECTOMY     TUBAL LIGATION      Social History   Socioeconomic History   Marital status: Married    Spouse name: Not on file   Number of children: Not on file   Years of education: Not on file   Highest education level: Not on file  Occupational History   Not on file  Tobacco Use   Smoking status: Former    Types: Cigarettes   Smokeless tobacco: Never  Vaping Use   Vaping status: Never Used  Substance and Sexual Activity   Alcohol use: Yes    Comment: occ   Drug use: No   Sexual activity: Not on file  Other Topics Concern   Not on file  Social History Narrative   Are you right handed or left handed? Left handed    Are you currently employed ? 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 No    Who lives with you?  Husband    What type of home do you live in: 1 story or 2 story? [address]ses [address]        Social Determinants of Health   Financial Resource Strain: Not on file  Food Insecurity: Not on file  Transportation Needs: Not on file  Physical Activity: Not on file  Stress: Not on file  Social Connections: Not on file  Intimate Partner Violence: Not on file    Family History  Problem Relation Age of Onset   Hypertension Mother     Allergies  Allergen Reactions   Pineapple Anaphylaxis, Itching and Swelling    Other reaction(s): Swelling    Pineapple Extract Anaphylaxis   Doxycycline     nausea   Kiwi Extract Swelling    Other reaction(s): Swelling     Review of Systems  Constitutional:  Positive for malaise/fatigue. Negative for chills, fever and weight loss.  HENT: Negative.    Eyes: Negative.   Respiratory: Negative.  Negative for cough and shortness of breath.   Cardiovascular: Negative.  Negative for chest pain, palpitations and leg swelling.  Gastrointestinal: Negative.  Negative for abdominal pain, constipation, diarrhea, heartburn, nausea and vomiting.  Genitourinary: Negative.  Negative for dysuria and flank pain.  Musculoskeletal: Negative.  Negative for joint pain and myalgias.  Skin: Negative.   Neurological: Negative.  Negative for dizziness, tingling, tremors and headaches.  Endo/Heme/Allergies: Negative.   Psychiatric/Behavioral: Negative.  Negative for depression and suicidal ideas. The patient is not nervous/anxious.        Objective:   BP (!) 140/88   Pulse 70   Ht 5\' 5"  (1.651 m)   Wt 244 lb 3.2 oz (110.8 kg)   LMP 04/02/2015 (Within Days)   SpO2 98%   BMI 40.64 kg/m   Vitals:   04/09/23 1138  BP: (!) 140/88  Pulse: 70  Height: 5\' 5"  (1.651 m)  Weight: 244 lb 3.2 oz (110.8 kg)  SpO2: 98%  BMI (Calculated): 40.64    Physical Exam Vitals and nursing note reviewed.  Constitutional:      Appearance: Normal appearance.  HENT:     Head:  Normocephalic and atraumatic.     Nose: Nose normal.     Mouth/Throat:     Mouth: Mucous membranes are moist.     Pharynx: Oropharynx is clear.  Eyes:     Conjunctiva/sclera: Conjunctivae normal.     Pupils: Pupils are equal, round, and reactive to light.  Cardiovascular:     Rate and Rhythm: Normal rate and regular rhythm.     Pulses: Normal pulses.     Heart sounds: Normal heart sounds. No murmur heard. Pulmonary:     Effort: Pulmonary effort is normal.     Breath sounds: Normal breath sounds. No wheezing.  Abdominal:     General: Bowel sounds are normal.     Palpations: Abdomen is soft.     Tenderness: There is no abdominal tenderness. There is no right CVA tenderness or left CVA tenderness.  Musculoskeletal:        General: Normal range of motion.     Cervical back: Normal range of motion.     Right lower leg: No edema.     Left lower leg: No edema.  Skin:    General: Skin is warm and dry.  Neurological:     General: No focal deficit present.     Mental Status: She is alert and oriented to person, place, and time.  Psychiatric:        Mood and Affect: Mood normal.        Behavior: Behavior normal.      No results found for any visits on 04/09/23.  No results found for this or any previous visit (from the past 2160 hour(s)).    Assessment & Plan:  Start metformin 500 mg twice a day.  Strict diet control. Monitor blood sugars at home. Problem List Items Addressed This Visit     Essential hypertension, benign - Primary   Relevant Medications   simvastatin (ZOCOR) 20 MG tablet   GAD (generalized anxiety disorder)   Other Visit Diagnoses     Hypertension associated with diabetes (HCC)       Relevant Medications   metFORMIN (GLUCOPHAGE) 500 MG tablet   simvastatin (ZOCOR) 20 MG tablet   Continuous Glucose Receiver (FREESTYLE LIBRE 3 READER) DEVI   Continuous Glucose Sensor (FREESTYLE LIBRE 3 PLUS SENSOR) MISC   Combined hyperlipidemia associated with type 2  diabetes mellitus (HCC)       Relevant Medications   metFORMIN (GLUCOPHAGE) 500 MG tablet   simvastatin (ZOCOR) 20 MG tablet       Return in about 2 weeks (around 04/23/2023).  Total time spent: 30 minutes  Margaretann Loveless, MD  04/09/2023   This document may have been prepared by [facility] Voice Recognition software and as such may include unintentional dictation errors.

## 2023-04-29 ENCOUNTER — Ambulatory Visit: Payer: PRIVATE HEALTH INSURANCE | Admitting: Internal Medicine

## 2023-05-03 ENCOUNTER — Ambulatory Visit: Payer: PRIVATE HEALTH INSURANCE | Admitting: Internal Medicine

## 2023-06-15 IMAGING — DX DG CHEST 1V PORT
1 series · 1 of 1 positions shown · non-contrast
Comparison: 12/26/2019

CLINICAL DATA: Chest pain

Pressured
Shortness of breath
Nausea
EXAM:
PORTABLE CHEST - 1 VIEW

[chest ap]
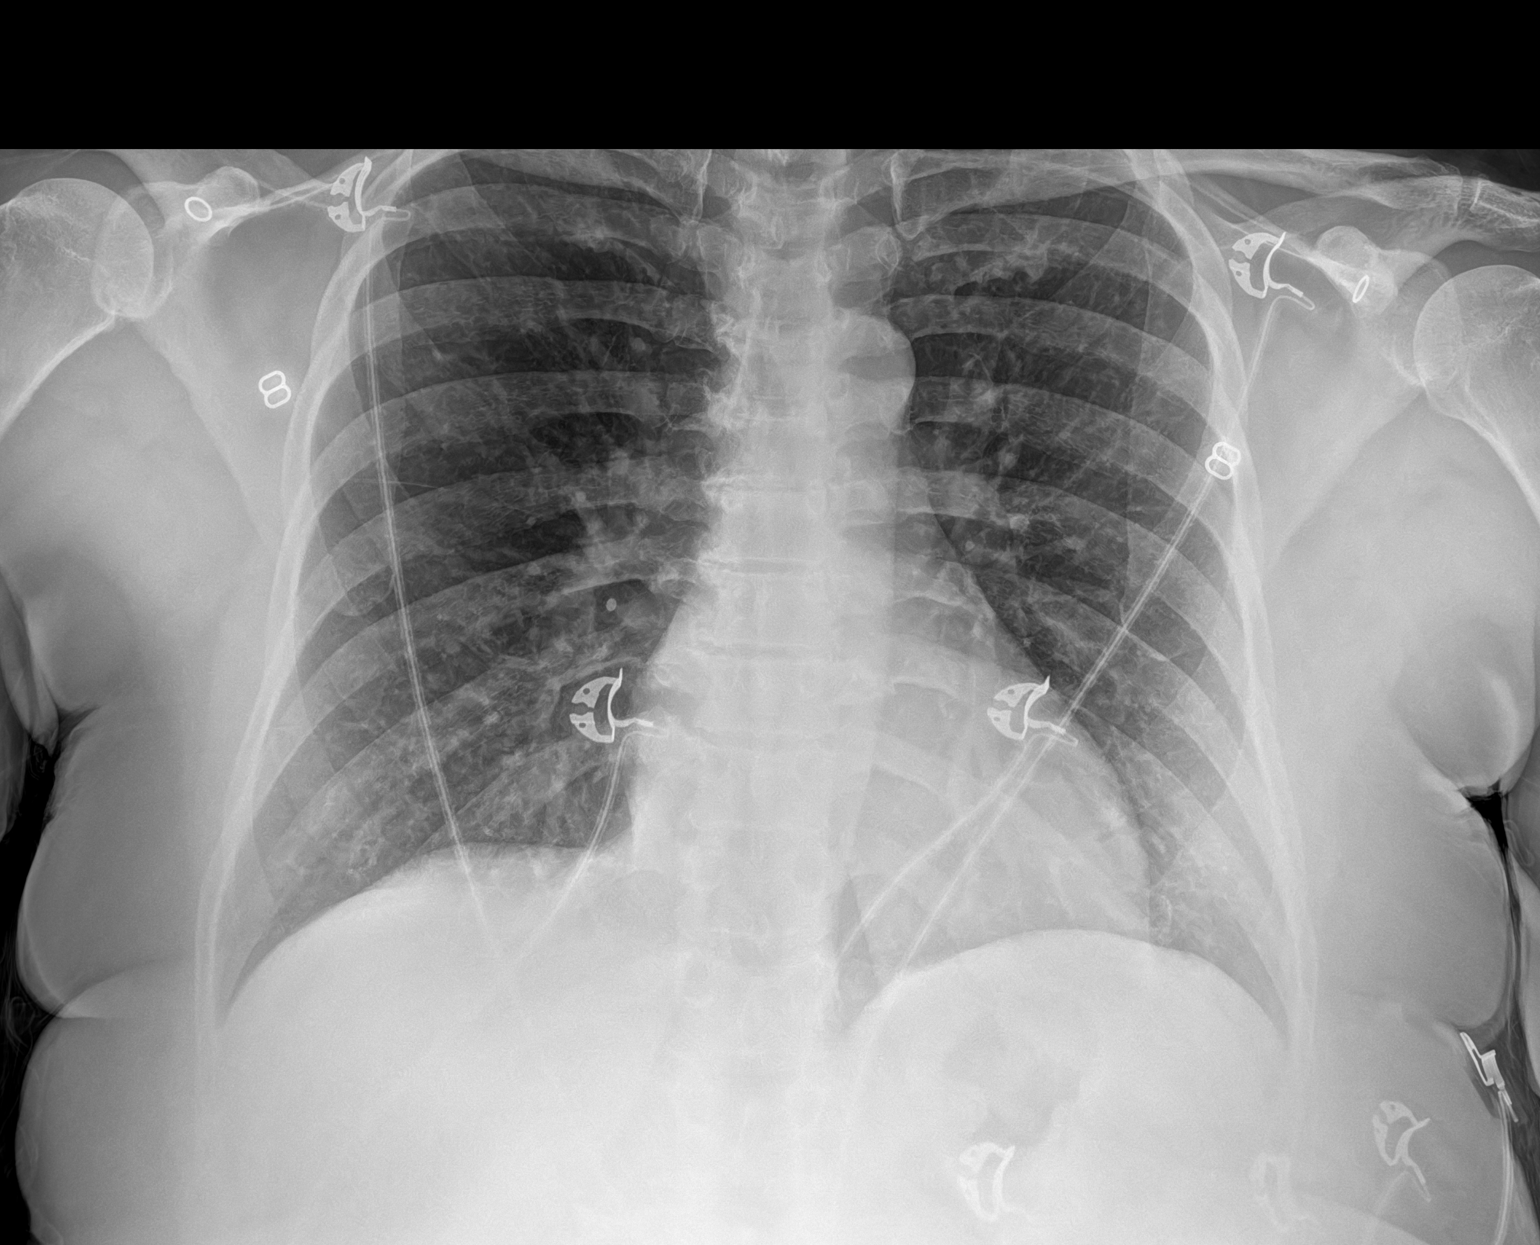

[1 of 1 positions shown; findings below may reference images not displayed]

FINDINGS: Cardiomediastinal silhouette and pulmonary vasculature are within
normal limits.

Lungs are clear.
IMPRESSION: No acute cardiopulmonary process.

## 2023-06-28 ENCOUNTER — Encounter: Payer: Self-pay | Admitting: Neurology

## 2023-06-28 ENCOUNTER — Ambulatory Visit: Payer: BC Managed Care – PPO | Admitting: Neurology

## 2023-07-29 ENCOUNTER — Other Ambulatory Visit: Payer: Self-pay | Admitting: Internal Medicine

## 2023-09-22 ENCOUNTER — Other Ambulatory Visit: Payer: Self-pay | Admitting: Internal Medicine

## 2023-09-22 DIAGNOSIS — I152 Hypertension secondary to endocrine disorders: Secondary | ICD-10-CM

## 2023-09-28 ENCOUNTER — Encounter: Payer: Self-pay | Admitting: Internal Medicine

## 2023-09-28 ENCOUNTER — Ambulatory Visit: Payer: PRIVATE HEALTH INSURANCE | Admitting: Internal Medicine

## 2023-09-28 VITALS — BP 126/84 | HR 75 | Ht 65.0 in | Wt 232.2 lb

## 2023-09-28 DIAGNOSIS — F411 Generalized anxiety disorder: Secondary | ICD-10-CM

## 2023-09-28 DIAGNOSIS — R7303 Prediabetes: Secondary | ICD-10-CM | POA: Diagnosis not present

## 2023-09-28 DIAGNOSIS — E782 Mixed hyperlipidemia: Secondary | ICD-10-CM

## 2023-09-28 DIAGNOSIS — I10 Essential (primary) hypertension: Secondary | ICD-10-CM | POA: Diagnosis not present

## 2023-09-28 NOTE — Progress Notes (Signed)
 Established Patient Office Visit  Subjective:  Patient ID: Paige Watson, female    DOB: 1971/03/08  Age: 53 y.o. MRN: [id number]  Chief Complaint  Patient presents with   Follow-up    6 month follow up    Patient comes in for her follow-up today.  She has  lost weight since her last visit, she has been taking metformin regularly.  However she does admit to abdominal cramps and diarrhea at times.  But no nausea or vomiting.  She is fasting for blood work today.  Her PHQ-9/GAD score is 12/7.  She has a prescription for Zoloft but she only takes half a tablet as needed.  Patient advised to start taking it regularly a whole tablet.  She admits to stress at work .     No other concerns at this time.   Past Medical History:  Diagnosis Date   Anemia    Diastolic dysfunction    Endometriosis    Hypertension    Vitamin D deficiency     Past Surgical History:  Procedure Laterality Date   CESAREAN SECTION     CHOLECYSTECTOMY     TUBAL LIGATION      Social History   Socioeconomic History   Marital status: Married    Spouse name: Not on file   Number of children: Not on file   Years of education: Not on file   Highest education level: Not on file  Occupational History   Not on file  Tobacco Use   Smoking status: Former    Types: Cigarettes   Smokeless tobacco: Never  Vaping Use   Vaping status: Never Used  Substance and Sexual Activity   Alcohol use: Yes    Comment: occ   Drug use: No   Sexual activity: Not on file  Other Topics Concern   Not on file  Social History Narrative   Are you right handed or left handed? Left handed    Are you currently employed ? 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 No    Who lives with you? Husband    What type of home do you live in: 1 story or 2 story? [address]ses [address]        Social Drivers of Corporate investment banker Strain: Not on file  Food Insecurity: Not on file   Transportation Needs: Not on file  Physical Activity: Not on file  Stress: Not on file  Social Connections: Not on file  Intimate Partner Violence: Not on file    Family History  Problem Relation Age of Onset   Hypertension Mother     Allergies  Allergen Reactions   Pineapple Anaphylaxis, Itching and Swelling    Other reaction(s): Swelling    Pineapple Extract Anaphylaxis   Doxycycline     nausea   Kiwi Extract Swelling    Other reaction(s): Swelling     Outpatient Medications Prior to Visit  Medication Sig   aspirin EC 81 MG tablet Take 81 mg by mouth daily. Swallow whole.   Continuous Glucose Sensor (FREESTYLE LIBRE 3 SENSOR) MISC CHANGE SENSOR EVERY 15 DAYS.   cyclobenzaprine (FLEXERIL) 10 MG tablet Take 1 tablet (10 mg total) by mouth 3 (three) times daily as needed for muscle spasms.   enalapril-hydrochlorothiazide (VASERETIC) 10-25 MG tablet TAKE 1 TABLET BY MOUTH EVERY DAY   metFORMIN (GLUCOPHAGE) 500 MG tablet Take 1 tablet (500 mg total)  by mouth 2 (two) times daily with a meal.   simvastatin (ZOCOR) 20 MG tablet Take 1 tablet (20 mg total) by mouth daily at 6 PM.   Continuous Glucose Receiver (FREESTYLE LIBRE 3 READER) DEVI 1 each by Does not apply route daily. (Patient not taking: Reported on 09/28/2023)   No facility-administered medications prior to visit.    Review of Systems  Constitutional:  Positive for weight loss. Negative for chills, diaphoresis, fever and malaise/fatigue.  HENT: Negative.  Negative for congestion and sore throat.   Eyes: Negative.   Respiratory: Negative.  Negative for cough, shortness of breath and stridor.   Cardiovascular: Negative.  Negative for chest pain, palpitations and leg swelling.  Gastrointestinal: Negative.  Negative for abdominal pain, constipation, diarrhea, heartburn, nausea and vomiting.  Genitourinary: Negative.  Negative for dysuria and flank pain.  Musculoskeletal: Negative.  Negative for joint pain and myalgias.   Skin: Negative.   Neurological: Negative.  Negative for dizziness, tingling, tremors and headaches.  Endo/Heme/Allergies: Negative.   Psychiatric/Behavioral:  Negative for depression and suicidal ideas. The patient is nervous/anxious.        Objective:   BP 126/84   Pulse 75   Ht 5\' 5"  (1.651 m)   Wt 232 lb 3.2 oz (105.3 kg)   LMP 04/02/2015 (Within Days)   SpO2 98%   BMI 38.64 kg/m   Vitals:   09/28/23 0925  BP: 126/84  Pulse: 75  Height: 5\' 5"  (1.651 m)  Weight: 232 lb 3.2 oz (105.3 kg)  SpO2: 98%  BMI (Calculated): 38.64    Physical Exam Vitals and nursing note reviewed.  Constitutional:      Appearance: Normal appearance.  HENT:     Head: Normocephalic and atraumatic.     Nose: Nose normal.     Mouth/Throat:     Mouth: Mucous membranes are moist.     Pharynx: Oropharynx is clear.  Eyes:     Conjunctiva/sclera: Conjunctivae normal.     Pupils: Pupils are equal, round, and reactive to light.  Cardiovascular:     Rate and Rhythm: Normal rate and regular rhythm.     Pulses: Normal pulses.     Heart sounds: Normal heart sounds. No murmur heard. Pulmonary:     Effort: Pulmonary effort is normal.     Breath sounds: Normal breath sounds. No wheezing.  Abdominal:     General: Bowel sounds are normal.     Palpations: Abdomen is soft.     Tenderness: There is no abdominal tenderness. There is no right CVA tenderness or left CVA tenderness.  Musculoskeletal:        General: Normal range of motion.     Cervical back: Normal range of motion.     Right lower leg: No edema.     Left lower leg: No edema.  Skin:    General: Skin is warm and dry.  Neurological:     General: No focal deficit present.     Mental Status: She is alert and oriented to person, place, and time.  Psychiatric:        Mood and Affect: Mood normal.        Behavior: Behavior normal.      No results found for any visits on 09/28/23.  No results found for this or any previous visit (from  the past 2160 hours).    Assessment & Plan:  Continue current medications.  Check labs.  Advised to take her Zoloft regularly.  Will return in 6  weeks to monitor weight and adjust medications further. Problem List Items Addressed This Visit     Essential hypertension, benign - Primary   Relevant Orders   CMP14+EGFR   GAD (generalized anxiety disorder)   Mixed hyperlipidemia   Relevant Orders   Lipid Panel w/o Chol/HDL Ratio   Prediabetes   Relevant Orders   Hemoglobin A1c    Return in about 6 weeks (around 11/09/2023).   Total time spent: 30 minutes  Margaretann Loveless, MD  09/28/2023   This document may have been prepared by [facility] Voice Recognition software and as such may include unintentional dictation errors.

## 2023-09-29 LAB — CMP14+EGFR
ALT: 66 IU/L — ABNORMAL HIGH (ref 0–32)
AST: 71 IU/L — ABNORMAL HIGH (ref 0–40)
Albumin: 4 g/dL (ref 3.8–4.9)
Alkaline Phosphatase: 78 IU/L (ref 44–121)
BUN/Creatinine Ratio: 11 (ref 9–23)
BUN: 9 mg/dL (ref 6–24)
Bilirubin Total: 0.6 mg/dL (ref 0.0–1.2)
CO2: 27 mmol/L (ref 20–29)
Calcium: 9.3 mg/dL (ref 8.7–10.2)
Chloride: 102 mmol/L (ref 96–106)
Creatinine, Ser: 0.85 mg/dL (ref 0.57–1.00)
Globulin, Total: 3 g/dL (ref 1.5–4.5)
Glucose: 125 mg/dL — ABNORMAL HIGH (ref 70–99)
Potassium: 4.3 mmol/L (ref 3.5–5.2)
Sodium: 145 mmol/L — ABNORMAL HIGH (ref 134–144)
Total Protein: 7 g/dL (ref 6.0–8.5)
eGFR: 81 mL/min/{1.73_m2} (ref >59)

## 2023-09-29 LAB — HEMOGLOBIN A1C
Est. average glucose Bld gHb Est-mCnc: 143 mg/dL
Hgb A1c MFr Bld: 6.6 % — ABNORMAL HIGH (ref 4.8–5.6)

## 2023-09-29 LAB — LIPID PANEL W/O CHOL/HDL RATIO
Cholesterol, Total: 178 mg/dL (ref 100–199)
HDL: 54 mg/dL (ref >39)
LDL Chol Calc (NIH): 103 mg/dL — ABNORMAL HIGH (ref 0–99)
Triglycerides: 118 mg/dL (ref 0–149)
VLDL Cholesterol Cal: 21 mg/dL (ref 5–40)

## 2023-09-30 ENCOUNTER — Other Ambulatory Visit: Payer: Self-pay | Admitting: Internal Medicine

## 2023-09-30 DIAGNOSIS — E669 Obesity, unspecified: Secondary | ICD-10-CM

## 2023-09-30 MED ORDER — TIRZEPATIDE-WEIGHT MANAGEMENT 2.5 MG/0.5ML ~~LOC~~ SOAJ: 2.5000 mg | SUBCUTANEOUS | 0 refills | Status: DC

## 2023-10-11 NOTE — Progress Notes (Signed)
 Patient notified

## 2023-10-13 ENCOUNTER — Telehealth: Payer: Self-pay | Admitting: Internal Medicine

## 2023-10-13 NOTE — Telephone Encounter (Signed)
 Patient left VM that the medication that her insurance covers is Mounjaro , since they do not cover Zepbound . Please send Rx for Mounjaro .

## 2023-10-14 ENCOUNTER — Other Ambulatory Visit: Payer: Self-pay | Admitting: Internal Medicine

## 2023-10-14 DIAGNOSIS — E669 Obesity, unspecified: Secondary | ICD-10-CM

## 2023-10-14 DIAGNOSIS — E1169 Type 2 diabetes mellitus with other specified complication: Secondary | ICD-10-CM

## 2023-10-14 MED ORDER — MOUNJARO 2.5 MG/0.5ML ~~LOC~~ SOAJ
2.5000 mg | SUBCUTANEOUS | 0 refills | Status: DC
Start: 2023-10-14 — End: 2023-10-14

## 2023-11-16 ENCOUNTER — Ambulatory Visit: Payer: Self-pay | Admitting: Internal Medicine

## 2023-11-16 ENCOUNTER — Other Ambulatory Visit: Payer: Self-pay | Admitting: Internal Medicine

## 2023-11-16 ENCOUNTER — Encounter: Payer: Self-pay | Admitting: Internal Medicine

## 2023-11-16 ENCOUNTER — Ambulatory Visit (INDEPENDENT_AMBULATORY_CARE_PROVIDER_SITE_OTHER): Payer: PRIVATE HEALTH INSURANCE | Admitting: Internal Medicine

## 2023-11-16 VITALS — BP 112/76 | HR 76 | Ht 65.0 in | Wt 230.2 lb

## 2023-11-16 DIAGNOSIS — I10 Essential (primary) hypertension: Secondary | ICD-10-CM | POA: Diagnosis not present

## 2023-11-16 DIAGNOSIS — L304 Erythema intertrigo: Secondary | ICD-10-CM

## 2023-11-16 DIAGNOSIS — F411 Generalized anxiety disorder: Secondary | ICD-10-CM | POA: Diagnosis not present

## 2023-11-16 DIAGNOSIS — E1169 Type 2 diabetes mellitus with other specified complication: Secondary | ICD-10-CM

## 2023-11-16 DIAGNOSIS — E669 Obesity, unspecified: Secondary | ICD-10-CM | POA: Diagnosis not present

## 2023-11-16 DIAGNOSIS — E782 Mixed hyperlipidemia: Secondary | ICD-10-CM

## 2023-11-16 LAB — POCT CBG (FASTING - GLUCOSE)-MANUAL ENTRY: Glucose Fasting, POC: 109 mg/dL — AB (ref 70–99)

## 2023-11-16 MED ORDER — NYSTATIN 100000 UNIT/GM EX OINT
1.0000 | TOPICAL_OINTMENT | Freq: Two times a day (BID) | CUTANEOUS | 0 refills | Status: AC
Start: 2023-11-16 — End: ?

## 2023-11-16 MED ORDER — NYSTATIN 100000 UNIT/GM EX POWD: 1.0000 | Freq: Three times a day (TID) | CUTANEOUS | 2 refills | Status: AC

## 2023-11-16 MED ORDER — CLOTRIMAZOLE-BETAMETHASONE 1-0.05 % EX CREA: 1.0000 | TOPICAL_CREAM | Freq: Two times a day (BID) | CUTANEOUS | 0 refills | Status: AC

## 2023-11-16 MED ORDER — MOUNJARO 5 MG/0.5ML ~~LOC~~ SOAJ: 5.0000 mg | SUBCUTANEOUS | 1 refills | Status: DC

## 2023-11-16 NOTE — Progress Notes (Signed)
 Established Patient Office Visit  Subjective:  Patient ID: Paige Watson, female    DOB: 1971-03-12  Age: 53 y.o. MRN: [id number]  Chief Complaint  Patient presents with   Follow-up    6 week follow up    Patient comes in for follow up today. Started Mounjaro  at 2.5 mg /week. Initially had mild nausea but it has subsided after 3rd dose.Mild constipation, but getting better, So far lost 2 pounds. Will increase dose to 5mg /week. Otherwise feels well, headaches or dizziness, no abdominal pain. Mentions a pruritic eruption under her breasts, groins and upper thighs, will send in Nystatin powder and cream.    No other concerns at this time.   Past Medical History:  Diagnosis Date   Anemia    Diastolic dysfunction    Endometriosis    Hypertension    Vitamin D deficiency     Past Surgical History:  Procedure Laterality Date   CESAREAN SECTION     CHOLECYSTECTOMY     TUBAL LIGATION      Social History   Socioeconomic History   Marital status: Married    Spouse name: Not on file   Number of children: Not on file   Years of education: Not on file   Highest education level: Not on file  Occupational History   Not on file  Tobacco Use   Smoking status: Former    Types: Cigarettes   Smokeless tobacco: Never  Vaping Use   Vaping status: Never Used  Substance and Sexual Activity   Alcohol use: Yes    Comment: occ   Drug use: No   Sexual activity: Not on file  Other Topics Concern   Not on file  Social History Narrative   Are you right handed or left handed? Left handed    Are you currently employed ? 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 No    Who lives with you? Husband    What type of home do you live in: 1 story or 2 story? [address]        Social Drivers of Corporate investment banker Strain: Not on file  Food Insecurity: Not on file  Transportation Needs: Not on file  Physical Activity: Not on file   Stress: Not on file  Social Connections: Not on file  Intimate Partner Violence: Not on file    Family History  Problem Relation Age of Onset   Hypertension Mother     Allergies  Allergen Reactions   Pineapple Anaphylaxis, Itching and Swelling    Other reaction(s): Swelling    Pineapple Extract Anaphylaxis   Doxycycline     nausea   Kiwi Extract Swelling    Other reaction(s): Swelling     Outpatient Medications Prior to Visit  Medication Sig   aspirin  EC 81 MG tablet Take 81 mg by mouth daily. Swallow whole.   Continuous Glucose Sensor (FREESTYLE LIBRE 3 SENSOR) MISC CHANGE SENSOR EVERY 15 DAYS.   cyclobenzaprine  (FLEXERIL ) 10 MG tablet Take 1 tablet (10 mg total) by mouth 3 (three) times daily as needed for muscle spasms.   enalapril -hydrochlorothiazide  (VASERETIC ) 10-25 MG tablet TAKE 1 TABLET BY MOUTH EVERY DAY   metFORMIN  (GLUCOPHAGE ) 500 MG tablet Take 1 tablet (500 mg total) by mouth 2 (two) times daily with a meal.   simvastatin  (ZOCOR ) 20 MG tablet Take 1 tablet (20 mg total) by mouth daily at  6 PM.   [DISCONTINUED] tirzepatide  (MOUNJARO ) 2.5 MG/0.5ML Pen INJECT 2.5 MG SUBCUTANEOUSLY WEEKLY   Continuous Glucose Receiver (FREESTYLE LIBRE 3 READER) DEVI 1 each by Does not apply route daily. (Patient not taking: Reported on 11/16/2023)   No facility-administered medications prior to visit.    Review of Systems  Constitutional: Negative.  Negative for chills, fever and malaise/fatigue.  HENT: Negative.  Negative for sore throat.   Eyes: Negative.   Respiratory: Negative.  Negative for cough and shortness of breath.   Cardiovascular: Negative.  Negative for chest pain, palpitations and leg swelling.  Gastrointestinal: Negative.  Negative for abdominal pain, constipation, diarrhea, heartburn, nausea and vomiting.  Genitourinary: Negative.  Negative for dysuria and flank pain.  Musculoskeletal: Negative.  Negative for joint pain and myalgias.  Neurological: Negative.   Negative for dizziness, tingling, tremors, sensory change and headaches.  Endo/Heme/Allergies: Negative.   Psychiatric/Behavioral: Negative.  Negative for depression and suicidal ideas. The patient is not nervous/anxious.        Objective:   BP 112/76   Pulse 76   Ht 5\' 5"  (1.651 m)   Wt 230 lb 3.2 oz (104.4 kg)   LMP 04/02/2015 (Within Days)   SpO2 97%   BMI 38.31 kg/m   Vitals:   11/16/23 0945  BP: 112/76  Pulse: 76  Height: 5\' 5"  (1.651 m)  Weight: 230 lb 3.2 oz (104.4 kg)  SpO2: 97%  BMI (Calculated): 38.31    Physical Exam Vitals and nursing note reviewed.  Constitutional:      Appearance: Normal appearance.  HENT:     Head: Normocephalic and atraumatic.     Nose: Nose normal.     Mouth/Throat:     Mouth: Mucous membranes are moist.     Pharynx: Oropharynx is clear.  Eyes:     Conjunctiva/sclera: Conjunctivae normal.     Pupils: Pupils are equal, round, and reactive to light.  Cardiovascular:     Rate and Rhythm: Normal rate and regular rhythm.     Pulses: Normal pulses.     Heart sounds: Normal heart sounds. No murmur heard. Pulmonary:     Effort: Pulmonary effort is normal.     Breath sounds: Normal breath sounds. No wheezing.  Abdominal:     General: Bowel sounds are normal.     Palpations: Abdomen is soft.     Tenderness: There is no abdominal tenderness. There is no right CVA tenderness or left CVA tenderness.  Musculoskeletal:        General: Normal range of motion.     Cervical back: Normal range of motion.     Right lower leg: No edema.     Left lower leg: No edema.  Skin:    General: Skin is warm and dry.  Neurological:     General: No focal deficit present.     Mental Status: She is alert and oriented to person, place, and time.  Psychiatric:        Mood and Affect: Mood normal.        Behavior: Behavior normal.      Results for orders placed or performed in visit on 11/16/23  POCT CBG (Fasting - Glucose)  Result Value Ref Range    Glucose Fasting, POC 109 (A) 70 - 99 mg/dL    Recent Results (from the past 2160 hours)  CMP14+EGFR     Status: Abnormal   Collection Time: 09/28/23 10:07 AM  Result Value Ref Range   Glucose 125 (H) 70 -  99 mg/dL   BUN 9 6 - 24 mg/dL   Creatinine, Ser 1.61 0.57 - 1.00 mg/dL   eGFR 81 >09 UE/AVW/0.98   BUN/Creatinine Ratio 11 9 - 23   Sodium 145 (H) 134 - 144 mmol/L   Potassium 4.3 3.5 - 5.2 mmol/L   Chloride 102 96 - 106 mmol/L   CO2 27 20 - 29 mmol/L   Calcium  9.3 8.7 - 10.2 mg/dL   Total Protein 7.0 6.0 - 8.5 g/dL   Albumin 4.0 3.8 - 4.9 g/dL   Globulin, Total 3.0 1.5 - 4.5 g/dL   Bilirubin Total 0.6 0.0 - 1.2 mg/dL   Alkaline Phosphatase 78 44 - 121 IU/L   AST 71 (H) 0 - 40 IU/L   ALT 66 (H) 0 - 32 IU/L  Lipid Panel w/o Chol/HDL Ratio     Status: Abnormal   Collection Time: 09/28/23 10:07 AM  Result Value Ref Range   Cholesterol, Total 178 100 - 199 mg/dL   Triglycerides 119 0 - 149 mg/dL   HDL 54 >14 mg/dL   VLDL Cholesterol Cal 21 5 - 40 mg/dL   LDL Chol Calc (NIH) 782 (H) 0 - 99 mg/dL  Hemoglobin N5A     Status: Abnormal   Collection Time: 09/28/23 10:07 AM  Result Value Ref Range   Hgb A1c MFr Bld 6.6 (H) 4.8 - 5.6 %    Comment:          Prediabetes: 5.7 - 6.4          Diabetes: >6.4          Glycemic control for adults with diabetes: <7.0    Est. average glucose Bld gHb Est-mCnc 143 mg/dL  POCT CBG (Fasting - Glucose)     Status: Abnormal   Collection Time: 11/16/23  9:50 AM  Result Value Ref Range   Glucose Fasting, POC 109 (A) 70 - 99 mg/dL      Assessment & Plan:  Increase dose of Mounjaro  to 5 mg/week. Continue other meds- Add antifungal cream and powder for intertrigo. Problem List Items Addressed This Visit     Essential hypertension, benign   GAD (generalized anxiety disorder)   Mixed hyperlipidemia   Other Visit Diagnoses       Type 2 diabetes mellitus with obesity (HCC)    -  Primary   Relevant Medications   tirzepatide  (MOUNJARO ) 5  MG/0.5ML Pen   Other Relevant Orders   POCT CBG (Fasting - Glucose) (Completed)     Intertrigo       Relevant Medications   clotrimazole-betamethasone (LOTRISONE) cream   nystatin powder   nystatin ointment (MYCOSTATIN)       Return in about 4 weeks (around 12/14/2023).   Total time spent: 30 minutes  Aisha Hove, MD  11/16/2023   This document may have been prepared by [facility] Voice Recognition software and as such may include unintentional dictation errors.

## 2023-12-14 ENCOUNTER — Encounter: Payer: Self-pay | Admitting: Internal Medicine

## 2023-12-14 ENCOUNTER — Ambulatory Visit (INDEPENDENT_AMBULATORY_CARE_PROVIDER_SITE_OTHER): Payer: PRIVATE HEALTH INSURANCE | Admitting: Internal Medicine

## 2023-12-14 VITALS — BP 124/82 | HR 76 | Ht 65.0 in | Wt 224.8 lb

## 2023-12-14 DIAGNOSIS — E119 Type 2 diabetes mellitus without complications: Secondary | ICD-10-CM | POA: Insufficient documentation

## 2023-12-14 DIAGNOSIS — E669 Obesity, unspecified: Secondary | ICD-10-CM | POA: Diagnosis not present

## 2023-12-14 DIAGNOSIS — E1159 Type 2 diabetes mellitus with other circulatory complications: Secondary | ICD-10-CM | POA: Diagnosis not present

## 2023-12-14 DIAGNOSIS — I152 Hypertension secondary to endocrine disorders: Secondary | ICD-10-CM | POA: Diagnosis not present

## 2023-12-14 DIAGNOSIS — E782 Mixed hyperlipidemia: Secondary | ICD-10-CM

## 2023-12-14 DIAGNOSIS — E1169 Type 2 diabetes mellitus with other specified complication: Secondary | ICD-10-CM | POA: Diagnosis not present

## 2023-12-14 MED ORDER — MOUNJARO 7.5 MG/0.5ML ~~LOC~~ SOAJ: 7.5000 mg | SUBCUTANEOUS | 5 refills | Status: DC

## 2023-12-14 NOTE — Progress Notes (Signed)
 Established Patient Office Visit  Subjective:  Patient ID: Paige Watson, female    DOB: [birth year]  Age: 53 y.o. MRN: [id number]  Chief Complaint  Patient presents with   Follow-up    4 week follow up    Patient comes in for follow-up today.  She is tolerating Mounjaro  at 5 mg/week and managed to lose weight.  She denies any nausea or vomiting, no abdominal pain, but did have mild constipation which she is able to handle with increased fiber in her diet.  Denies chest pain or shortness of breath.  Patient is ready to increase the dose to 7.5 mg/week.  New prescription sent.    No other concerns at this time.   Past Medical History:  Diagnosis Date   Anemia    Diastolic dysfunction    Endometriosis    Hypertension    Vitamin D deficiency     Past Surgical History:  Procedure Laterality Date   CESAREAN SECTION     CHOLECYSTECTOMY     TUBAL LIGATION      Social History   Socioeconomic History   Marital status: Married    Spouse name: Not on file   Number of children: Not on file   Years of education: Not on file   Highest education level: Not on file  Occupational History   Not on file  Tobacco Use   Smoking status: Former    Types: Cigarettes   Smokeless tobacco: Never  Vaping Use   Vaping status: Never Used  Substance and Sexual Activity   Alcohol use: Yes    Comment: occ   Drug use: No   Sexual activity: Not on file  Other Topics Concern   Not on file  Social History Narrative   Are you right handed or left handed? Left handed    Are you currently employed ? 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 No    Who lives with you? Husband    What type of home do you live in: 1 story or 2 story? [address]        Social Drivers of Corporate investment banker Strain: Not on file  Food Insecurity: Not on file  Transportation Needs: Not on file  Physical Activity: Not on file  Stress: Not on file  Social  Connections: Not on file  Intimate Partner Violence: Not on file    Family History  Problem Relation Age of Onset   Hypertension Mother     Allergies  Allergen Reactions   Pineapple Anaphylaxis, Itching and Swelling    Other reaction(s): Swelling    Pineapple Extract Anaphylaxis   Doxycycline     nausea   Kiwi Extract Swelling    Other reaction(s): Swelling     Outpatient Medications Prior to Visit  Medication Sig   aspirin  EC 81 MG tablet Take 81 mg by mouth daily. Swallow whole.   clotrimazole -betamethasone  (LOTRISONE ) cream Apply 1 Application topically 2 (two) times daily.   Continuous Glucose Sensor (FREESTYLE LIBRE 3 SENSOR) MISC CHANGE SENSOR EVERY 15 DAYS.   cyclobenzaprine  (FLEXERIL ) 10 MG tablet Take 1 tablet (10 mg total) by mouth 3 (three) times daily as needed for muscle spasms.   enalapril -hydrochlorothiazide  (VASERETIC ) 10-25 MG tablet TAKE 1 TABLET BY MOUTH EVERY DAY   metFORMIN  (GLUCOPHAGE ) 500 MG tablet Take 1 tablet (500 mg total) by mouth 2 (two) times daily with a meal.  nystatin  ointment (MYCOSTATIN ) Apply 1 Application topically 2 (two) times daily.   nystatin  powder Apply 1 Application topically 3 (three) times daily.   simvastatin  (ZOCOR ) 20 MG tablet Take 1 tablet (20 mg total) by mouth daily at 6 PM.   [DISCONTINUED] tirzepatide  (MOUNJARO ) 5 MG/0.5ML Pen Inject 5 mg into the skin once a week.   Continuous Glucose Receiver (FREESTYLE LIBRE 3 READER) DEVI 1 each by Does not apply route daily. (Patient not taking: Reported on 12/14/2023)   No facility-administered medications prior to visit.    Review of Systems  Constitutional: Negative.  Negative for chills, diaphoresis, fever and malaise/fatigue.  HENT: Negative.    Eyes: Negative.   Respiratory: Negative.  Negative for cough and shortness of breath.   Cardiovascular: Negative.  Negative for chest pain, palpitations and leg swelling.  Gastrointestinal: Negative.  Negative for abdominal pain,  constipation, diarrhea, heartburn, nausea and vomiting.  Genitourinary: Negative.  Negative for dysuria and flank pain.  Musculoskeletal: Negative.  Negative for joint pain and myalgias.  Skin: Negative.   Neurological: Negative.  Negative for dizziness and headaches.  Endo/Heme/Allergies: Negative.   Psychiatric/Behavioral: Negative.  Negative for depression and suicidal ideas. The patient is not nervous/anxious.        Objective:   BP 124/82   Pulse 76   Ht 5' 5 (1.651 m)   Wt 224 lb 12.8 oz (102 kg)   LMP 04/02/2015 (Within Days)   SpO2 96%   BMI 37.41 kg/m   Vitals:   12/14/23 1425  BP: 124/82  Pulse: 76  Height: 5' 5 (1.651 m)  Weight: 224 lb 12.8 oz (102 kg)  SpO2: 96%  BMI (Calculated): 37.41    Physical Exam Vitals and nursing note reviewed.  Constitutional:      Appearance: Normal appearance.  HENT:     Head: Normocephalic and atraumatic.     Nose: Nose normal.     Mouth/Throat:     Mouth: Mucous membranes are moist.     Pharynx: Oropharynx is clear.   Eyes:     Conjunctiva/sclera: Conjunctivae normal.     Pupils: Pupils are equal, round, and reactive to light.    Cardiovascular:     Rate and Rhythm: Normal rate and regular rhythm.     Pulses: Normal pulses.     Heart sounds: Normal heart sounds. No murmur heard. Pulmonary:     Effort: Pulmonary effort is normal.     Breath sounds: Normal breath sounds. No wheezing.  Abdominal:     General: Bowel sounds are normal.     Palpations: Abdomen is soft.     Tenderness: There is no abdominal tenderness. There is no right CVA tenderness or left CVA tenderness.   Musculoskeletal:        General: Normal range of motion.     Cervical back: Normal range of motion.     Right lower leg: No edema.     Left lower leg: No edema.   Skin:    General: Skin is warm and dry.   Neurological:     General: No focal deficit present.     Mental Status: She is alert and oriented to person, place, and time.    Psychiatric:        Mood and Affect: Mood normal.        Behavior: Behavior normal.      No results found for any visits on 12/14/23.  Recent Results (from the past 2160 hours)  CMP14+EGFR  Status: Abnormal   Collection Time: 09/28/23 10:07 AM  Result Value Ref Range   Glucose 125 (H) 70 - 99 mg/dL   BUN 9 6 - 24 mg/dL   Creatinine, Ser 9.14 0.57 - 1.00 mg/dL   eGFR 81 >40 fO/fpw/8.26   BUN/Creatinine Ratio 11 9 - 23   Sodium 145 (H) 134 - 144 mmol/L   Potassium 4.3 3.5 - 5.2 mmol/L   Chloride 102 96 - 106 mmol/L   CO2 27 20 - 29 mmol/L   Calcium  9.3 8.7 - 10.2 mg/dL   Total Protein 7.0 6.0 - 8.5 g/dL   Albumin 4.0 3.8 - 4.9 g/dL   Globulin, Total 3.0 1.5 - 4.5 g/dL   Bilirubin Total 0.6 0.0 - 1.2 mg/dL   Alkaline Phosphatase 78 44 - 121 IU/L   AST 71 (H) 0 - 40 IU/L   ALT 66 (H) 0 - 32 IU/L  Lipid Panel w/o Chol/HDL Ratio     Status: Abnormal   Collection Time: 09/28/23 10:07 AM  Result Value Ref Range   Cholesterol, Total 178 100 - 199 mg/dL   Triglycerides 881 0 - 149 mg/dL   HDL 54 >60 mg/dL   VLDL Cholesterol Cal 21 5 - 40 mg/dL   LDL Chol Calc (NIH) 896 (H) 0 - 99 mg/dL  Hemoglobin J8r     Status: Abnormal   Collection Time: 09/28/23 10:07 AM  Result Value Ref Range   Hgb A1c MFr Bld 6.6 (H) 4.8 - 5.6 %    Comment:          Prediabetes: 5.7 - 6.4          Diabetes: >6.4          Glycemic control for adults with diabetes: <7.0    Est. average glucose Bld gHb Est-mCnc 143 mg/dL  POCT CBG (Fasting - Glucose)     Status: Abnormal   Collection Time: 11/16/23  9:50 AM  Result Value Ref Range   Glucose Fasting, POC 109 (A) 70 - 99 mg/dL      Assessment & Plan:  Continue current medications.  Increase Mounjaro  dose to 7.5 mg/week. Problem List Items Addressed This Visit     Hypertension associated with diabetes (HCC)   Relevant Medications   tirzepatide  (MOUNJARO ) 7.5 MG/0.5ML Pen   Combined hyperlipidemia associated with type 2 diabetes mellitus  (HCC) - Primary   Relevant Medications   tirzepatide  (MOUNJARO ) 7.5 MG/0.5ML Pen    Return in about 2 months (around 02/13/2024).   Total time spent: 25 minutes  FERNAND FREDY RAMAN, MD  12/14/2023   This document may have been prepared by [facility] Voice Recognition software and as such may include unintentional dictation errors.

## 2023-12-21 ENCOUNTER — Other Ambulatory Visit: Payer: Self-pay | Admitting: Internal Medicine

## 2023-12-21 DIAGNOSIS — E669 Obesity, unspecified: Secondary | ICD-10-CM

## 2024-01-25 ENCOUNTER — Other Ambulatory Visit: Payer: Self-pay | Admitting: Internal Medicine

## 2024-01-25 DIAGNOSIS — E1159 Type 2 diabetes mellitus with other circulatory complications: Secondary | ICD-10-CM

## 2024-02-14 ENCOUNTER — Other Ambulatory Visit: Payer: Self-pay | Admitting: Internal Medicine

## 2024-02-14 ENCOUNTER — Encounter: Payer: Self-pay | Admitting: Internal Medicine

## 2024-02-14 ENCOUNTER — Ambulatory Visit (INDEPENDENT_AMBULATORY_CARE_PROVIDER_SITE_OTHER): Payer: PRIVATE HEALTH INSURANCE | Admitting: Internal Medicine

## 2024-02-14 ENCOUNTER — Ambulatory Visit: Payer: Self-pay | Admitting: Internal Medicine

## 2024-02-14 VITALS — BP 110/78 | HR 85 | Ht 65.0 in | Wt 210.4 lb

## 2024-02-14 DIAGNOSIS — E1159 Type 2 diabetes mellitus with other circulatory complications: Secondary | ICD-10-CM

## 2024-02-14 DIAGNOSIS — R7303 Prediabetes: Secondary | ICD-10-CM

## 2024-02-14 DIAGNOSIS — E782 Mixed hyperlipidemia: Secondary | ICD-10-CM

## 2024-02-14 DIAGNOSIS — F411 Generalized anxiety disorder: Secondary | ICD-10-CM

## 2024-02-14 DIAGNOSIS — E1169 Type 2 diabetes mellitus with other specified complication: Secondary | ICD-10-CM | POA: Diagnosis not present

## 2024-02-14 DIAGNOSIS — D508 Other iron deficiency anemias: Secondary | ICD-10-CM

## 2024-02-14 DIAGNOSIS — E669 Obesity, unspecified: Secondary | ICD-10-CM

## 2024-02-14 DIAGNOSIS — Z1231 Encounter for screening mammogram for malignant neoplasm of breast: Secondary | ICD-10-CM

## 2024-02-14 DIAGNOSIS — E559 Vitamin D deficiency, unspecified: Secondary | ICD-10-CM

## 2024-02-14 DIAGNOSIS — I152 Hypertension secondary to endocrine disorders: Secondary | ICD-10-CM

## 2024-02-14 LAB — POCT CBG (FASTING - GLUCOSE)-MANUAL ENTRY: Glucose Fasting, POC: 101 mg/dL — AB (ref 70–99)

## 2024-02-14 NOTE — Progress Notes (Signed)
 Established Patient Office Visit  Subjective:  Patient ID: Paige Watson, female    DOB: 1970-07-25  Age: 53 y.o. MRN: [id number]  Chief Complaint  Patient presents with   Follow-up    2 month follow up    Patient comes in for follow up. She is currently on Mounjaro  7.5 mg/week and is tolerating it well. Lost weight and has no c/o nausea, vomiting or diarrhea. Needs labs and to schedule mammogram.    No other concerns at this time.   Past Medical History:  Diagnosis Date   Anemia    Diastolic dysfunction    Endometriosis    Hypertension    Vitamin D deficiency     Past Surgical History:  Procedure Laterality Date   CESAREAN SECTION     CHOLECYSTECTOMY     TUBAL LIGATION      Social History   Socioeconomic History   Marital status: Married    Spouse name: Not on file   Number of children: Not on file   Years of education: Not on file   Highest education level: Not on file  Occupational History   Not on file  Tobacco Use   Smoking status: Former    Types: Cigarettes   Smokeless tobacco: Never  Vaping Use   Vaping status: Never Used  Substance and Sexual Activity   Alcohol use: Yes    Comment: occ   Drug use: No   Sexual activity: Not on file  Other Topics Concern   Not on file  Social History Narrative   Are you right handed or left handed? Left handed    Are you currently employed ? 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 No    Who lives with you? Husband    What type of home do you live in: 1 story or 2 story? [address]        Social Drivers of Corporate investment banker Strain: Not on file  Food Insecurity: Not on file  Transportation Needs: Not on file  Physical Activity: Not on file  Stress: Not on file  Social Connections: Not on file  Intimate Partner Violence: Not on file    Family History  Problem Relation Age of Onset   Hypertension Mother     Allergies  Allergen Reactions    Pineapple Anaphylaxis, Itching and Swelling    Other reaction(s): Swelling    Pineapple Extract Anaphylaxis   Doxycycline     nausea   Kiwi Extract Swelling    Other reaction(s): Swelling     Outpatient Medications Prior to Visit  Medication Sig   aspirin  EC 81 MG tablet Take 81 mg by mouth daily. Swallow whole.   clotrimazole -betamethasone  (LOTRISONE ) cream Apply 1 Application topically 2 (two) times daily.   Continuous Glucose Sensor (FREESTYLE LIBRE 3 SENSOR) MISC CHANGE SENSOR EVERY 15 DAYS.   cyclobenzaprine  (FLEXERIL ) 10 MG tablet Take 1 tablet (10 mg total) by mouth 3 (three) times daily as needed for muscle spasms.   enalapril -hydrochlorothiazide  (VASERETIC ) 10-25 MG tablet TAKE 1 TABLET BY MOUTH EVERY DAY   metFORMIN  (GLUCOPHAGE ) 500 MG tablet TAKE 1 TABLET BY MOUTH 2 TIMES DAILY WITH A MEAL.   nystatin  ointment (MYCOSTATIN ) Apply 1 Application topically 2 (two) times daily.   nystatin  powder Apply 1 Application topically 3 (three) times daily.   simvastatin  (ZOCOR ) 20 MG tablet Take 1 tablet (20 mg total) by  mouth daily at 6 PM.   tirzepatide  (MOUNJARO ) 7.5 MG/0.5ML Pen Inject 7.5 mg into the skin once a week.   Continuous Glucose Receiver (FREESTYLE LIBRE 3 READER) DEVI 1 each by Does not apply route daily. (Patient not taking: Reported on 02/14/2024)   No facility-administered medications prior to visit.    Review of Systems  Constitutional: Negative.  Negative for chills, diaphoresis, fever and malaise/fatigue.  HENT: Negative.  Negative for congestion and sore throat.   Eyes: Negative.   Respiratory: Negative.  Negative for cough and shortness of breath.   Cardiovascular: Negative.  Negative for chest pain, palpitations and leg swelling.  Gastrointestinal: Negative.  Negative for abdominal pain, constipation, diarrhea, heartburn, nausea and vomiting.  Genitourinary: Negative.  Negative for dysuria and flank pain.  Musculoskeletal: Negative.  Negative for joint pain  and myalgias.  Skin: Negative.   Neurological: Negative.  Negative for dizziness, tingling, tremors and headaches.  Endo/Heme/Allergies: Negative.   Psychiatric/Behavioral: Negative.  Negative for depression and suicidal ideas. The patient is not nervous/anxious.        Objective:   BP 110/78   Pulse 85   Ht 5' 5 (1.651 m)   Wt 210 lb 6.4 oz (95.4 kg)   LMP 04/02/2015 (Within Days)   SpO2 98%   BMI 35.01 kg/m   Vitals:   02/14/24 1428  BP: 110/78  Pulse: 85  Height: 5' 5 (1.651 m)  Weight: 210 lb 6.4 oz (95.4 kg)  SpO2: 98%  BMI (Calculated): 35.01    Physical Exam Vitals and nursing note reviewed.  Constitutional:      Appearance: Normal appearance.  HENT:     Head: Normocephalic and atraumatic.     Nose: Nose normal.     Mouth/Throat:     Mouth: Mucous membranes are moist.     Pharynx: Oropharynx is clear.  Eyes:     Conjunctiva/sclera: Conjunctivae normal.     Pupils: Pupils are equal, round, and reactive to light.  Cardiovascular:     Rate and Rhythm: Normal rate and regular rhythm.     Pulses: Normal pulses.     Heart sounds: Normal heart sounds. No murmur heard. Pulmonary:     Effort: Pulmonary effort is normal.     Breath sounds: Normal breath sounds. No wheezing.  Abdominal:     General: Bowel sounds are normal.     Palpations: Abdomen is soft.     Tenderness: There is no abdominal tenderness. There is no right CVA tenderness or left CVA tenderness.  Musculoskeletal:        General: Normal range of motion.     Cervical back: Normal range of motion.     Right lower leg: No edema.     Left lower leg: No edema.  Skin:    General: Skin is warm and dry.  Neurological:     General: No focal deficit present.     Mental Status: She is alert and oriented to person, place, and time.  Psychiatric:        Mood and Affect: Mood normal.        Behavior: Behavior normal.      Results for orders placed or performed in visit on 02/14/24  POCT CBG  (Fasting - Glucose)  Result Value Ref Range   Glucose Fasting, POC 101 (A) 70 - 99 mg/dL    Recent Results (from the past 2160 hours)  POCT CBG (Fasting - Glucose)     Status: Abnormal   Collection  Time: 02/14/24  2:33 PM  Result Value Ref Range   Glucose Fasting, POC 101 (A) 70 - 99 mg/dL      Assessment & Plan:  Continue at current dosing.  Schedule mammogram. Problem List Items Addressed This Visit     GAD (generalized anxiety disorder)   Prediabetes   Relevant Orders   Hemoglobin A1c   Hypertension associated with diabetes (HCC) - Primary   Relevant Orders   CMP14+EGFR   CBC with Diff   Type 2 diabetes mellitus with obesity (HCC)   Relevant Orders   POCT CBG (Fasting - Glucose) (Completed)   Other Visit Diagnoses       Breast cancer screening by mammogram       Relevant Orders   MM 3D SCREENING MAMMOGRAM BILATERAL BREAST     Vitamin D deficiency       Relevant Orders   Vitamin D (25 hydroxy)     Other iron deficiency anemia       Relevant Orders   CBC with Diff       Return in about 2 months (around 04/15/2024).   Total time spent: 30 minutes  FERNAND FREDY RAMAN, MD  02/14/2024   This document may have been prepared by [facility] Voice Recognition software and as such may include unintentional dictation errors.

## 2024-02-23 ENCOUNTER — Other Ambulatory Visit: Payer: Self-pay | Admitting: Cardiology

## 2024-02-23 DIAGNOSIS — E1159 Type 2 diabetes mellitus with other circulatory complications: Secondary | ICD-10-CM

## 2024-04-17 ENCOUNTER — Encounter: Payer: Self-pay | Admitting: Internal Medicine

## 2024-04-17 ENCOUNTER — Ambulatory Visit: Payer: PRIVATE HEALTH INSURANCE | Admitting: Internal Medicine

## 2024-04-17 VITALS — BP 140/90 | HR 64 | Ht 65.0 in | Wt 202.8 lb

## 2024-04-17 DIAGNOSIS — E66811 Obesity, class 1: Secondary | ICD-10-CM

## 2024-04-17 DIAGNOSIS — E6609 Other obesity due to excess calories: Secondary | ICD-10-CM | POA: Insufficient documentation

## 2024-04-17 DIAGNOSIS — E1159 Type 2 diabetes mellitus with other circulatory complications: Secondary | ICD-10-CM

## 2024-04-17 DIAGNOSIS — E119 Type 2 diabetes mellitus without complications: Secondary | ICD-10-CM | POA: Diagnosis not present

## 2024-04-17 DIAGNOSIS — E782 Mixed hyperlipidemia: Secondary | ICD-10-CM

## 2024-04-17 DIAGNOSIS — E1169 Type 2 diabetes mellitus with other specified complication: Secondary | ICD-10-CM | POA: Diagnosis not present

## 2024-04-17 DIAGNOSIS — G43019 Migraine without aura, intractable, without status migrainosus: Secondary | ICD-10-CM | POA: Insufficient documentation

## 2024-04-17 DIAGNOSIS — F411 Generalized anxiety disorder: Secondary | ICD-10-CM

## 2024-04-17 DIAGNOSIS — Z1231 Encounter for screening mammogram for malignant neoplasm of breast: Secondary | ICD-10-CM | POA: Insufficient documentation

## 2024-04-17 DIAGNOSIS — I152 Hypertension secondary to endocrine disorders: Secondary | ICD-10-CM

## 2024-04-17 DIAGNOSIS — Z6834 Body mass index (BMI) 34.0-34.9, adult: Secondary | ICD-10-CM

## 2024-04-17 LAB — POCT CBG (FASTING - GLUCOSE)-MANUAL ENTRY: Glucose Fasting, POC: 134 mg/dL — AB (ref 70–99)

## 2024-04-17 LAB — POC CREATINE & ALBUMIN,URINE
Creatinine, POC: 300 mg/dL
Microalbumin Ur, POC: 80 mg/L

## 2024-04-17 MED ORDER — MOUNJARO 10 MG/0.5ML ~~LOC~~ SOAJ: 10.0000 mg | SUBCUTANEOUS | 1 refills | Status: DC

## 2024-04-17 MED ORDER — ZOLMITRIPTAN 5 MG PO TABS
5.0000 mg | ORAL_TABLET | ORAL | 0 refills | Status: DC | PRN
Start: 2024-04-17 — End: 2024-05-15

## 2024-04-17 MED ORDER — DULOXETINE HCL 30 MG PO CPEP: 30.0000 mg | ORAL_CAPSULE | Freq: Every day | ORAL | 2 refills | Status: DC

## 2024-04-17 NOTE — Progress Notes (Signed)
 Established Patient Office Visit  Subjective:  Patient ID: Paige Watson, female    DOB: [birth year]  Age: 53 y.o. MRN: [id number]  Chief Complaint  Patient presents with   Follow-up    2 month follow up    Patient is here today for follow up. She is due for routine blood work and UAC. Will order to be collected today. She is also due for mammogram; it was ordered 01/2024 will provide patient with contact information to get it scheduled.  Last eye exam was 02/2024 and reports her prescription increased. Denies any diabetic retinopathy, glaucoma, or cataracts at this time.  She reports increased family stressors and has not been eating the best. Zoloft was prescribed in the past and she was only taking 1/2 tablet every other day reported in 09/2023. Zoloft is not on patients medication treatment regimen at this time and she reports not taking it daily. When under increased stress and anxiety patient reports feeling as if she can continue her required tasks but it makes her feel overwhelmingly drained. Patient has hx of ADHD with GAD. Discussed current job and her work life balance. She has been looking for alternative employment but is worried about the job market and economy if she were to leave her current employer. Patient states she is exploring finding a Producer, Television/film/video at [employer] in [place], [place] as her previous Psychiatrist was unable to see patient for conflict of interest. Will start Cymbalta 30 mg daily. Reinforced need to discuss life stressors and anxiety with Psychiatrist regularly.  Patient endorses migraines have been more frequent with increased stress and they cause nausea, photophobia, and right sided facial pain. She states she took expired migraine medication that dissolves in her mouth. Zomig was last prescribed in 2023 for patients migraines as abortive therapy. Will send Zomig refill to take as needed. Educated patient that the increased stress and anxiety could be  contributing to increased migraines.  Patient reports her fasting blood glucose runs 120-130's; endorses increased cravings for sugar the last 2 weeks with increased portions.  Reports nausea with over eating and constipation. She reports constipation is not new and has been chronic issue. She has been trying to be more aware of her bathroom habits and does not need OTC stool softeners. Will increase Mounjaro  to 10 mg weekly injection and encouraged patient to eat more protein, stay hydrated, and increase fiber intake to help with constipation.  Patient wants to discuss FMLA paper work. She has endorsed negative effects at work by not being to eat properly  and having increased fatigue from over-working in conjunction to her increased stress at home. Recommended patient fill out the portion of paper work she needs to complete accurately and then submit it to office to be completed and sent to her employer.    No other concerns at this time.   Past Medical History:  Diagnosis Date   Anemia    Diastolic dysfunction    Endometriosis    Hypertension    Vitamin D deficiency     Past Surgical History:  Procedure Laterality Date   CESAREAN SECTION     CHOLECYSTECTOMY     TUBAL LIGATION      Social History   Socioeconomic History   Marital status: Married    Spouse name: Not on file   Number of children: Not on file   Years of education: Not on file   Highest education level: Not on file  Occupational History   Not  on file  Tobacco Use   Smoking status: Former    Types: Cigarettes   Smokeless tobacco: Never  Vaping Use   Vaping status: Never Used  Substance and Sexual Activity   Alcohol use: Yes    Comment: occ   Drug use: No   Sexual activity: Not on file  Other Topics Concern   Not on file  Social History Narrative   Are you right handed or left handed? Left handed    Are you currently employed ? 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 No    Who lives with you? Husband    What type of home do you live in: 1 story or 2 story? Uses [address]        Social Drivers of Corporate Investment Banker Strain: Not on file  Food Insecurity: Not on file  Transportation Needs: Not on file  Physical Activity: Not on file  Stress: Not on file  Social Connections: Not on file  Intimate Partner Violence: Not on file    Family History  Problem Relation Age of Onset   Hypertension Mother     Allergies  Allergen Reactions   Pineapple Anaphylaxis, Itching and Swelling    Other reaction(s): Swelling    Pineapple Extract Anaphylaxis   Doxycycline     nausea   Kiwi Extract Swelling    Other reaction(s): Swelling     Outpatient Medications Prior to Visit  Medication Sig   aspirin  EC 81 MG tablet Take 81 mg by mouth daily. Swallow whole.   Continuous Glucose Receiver (FREESTYLE LIBRE 3 READER) DEVI 1 each by Does not apply route daily.   Continuous Glucose Sensor (FREESTYLE LIBRE 3 SENSOR) MISC CHANGE SENSOR EVERY 15 DAYS.   cyclobenzaprine  (FLEXERIL ) 10 MG tablet Take 1 tablet (10 mg total) by mouth 3 (three) times daily as needed for muscle spasms.   enalapril -hydrochlorothiazide  (VASERETIC ) 10-25 MG tablet TAKE 1 TABLET BY MOUTH EVERY DAY   metFORMIN  (GLUCOPHAGE ) 500 MG tablet TAKE 1 TABLET BY MOUTH TWICE A DAY WITH FOOD   simvastatin  (ZOCOR ) 20 MG tablet Take 1 tablet (20 mg total) by mouth daily at 6 PM.   [DISCONTINUED] tirzepatide  (MOUNJARO ) 7.5 MG/0.5ML Pen Inject 7.5 mg into the skin once a week.   clotrimazole -betamethasone  (LOTRISONE ) cream Apply 1 Application topically 2 (two) times daily. (Patient not taking: Reported on 04/17/2024)   nystatin  ointment (MYCOSTATIN ) Apply 1 Application topically 2 (two) times daily. (Patient not taking: Reported on 04/17/2024)   nystatin  powder Apply 1 Application topically 3 (three) times daily. (Patient not taking: Reported on 04/17/2024)   No facility-administered  medications prior to visit.    Review of Systems  Constitutional:  Positive for malaise/fatigue. Negative for chills and fever.  HENT: Negative.  Negative for congestion and sore throat.   Eyes: Negative.  Negative for blurred vision and pain.  Respiratory: Negative.  Negative for cough and shortness of breath.   Cardiovascular: Negative.  Negative for chest pain, palpitations and leg swelling.  Gastrointestinal: Negative.  Negative for abdominal pain, blood in stool, constipation, diarrhea, heartburn, melena, nausea and vomiting.  Genitourinary: Negative.  Negative for dysuria, flank pain, frequency and urgency.  Musculoskeletal: Negative.  Negative for joint pain and myalgias.  Skin: Negative.   Neurological: Negative.  Negative for dizziness, tingling, sensory change, weakness and headaches.  Endo/Heme/Allergies: Negative.   Psychiatric/Behavioral:  Negative for depression and suicidal ideas.  The patient is nervous/anxious.        Objective:   BP (!) 140/90   Pulse 64   Ht 5' 5 (1.651 m)   Wt 202 lb 12.8 oz (92 kg)   LMP 04/02/2015 (Within Days)   SpO2 98%   BMI 33.75 kg/m   Vitals:   04/17/24 1447  BP: (!) 140/90  Pulse: 64  Height: 5' 5 (1.651 m)  Weight: 202 lb 12.8 oz (92 kg)  SpO2: 98%  BMI (Calculated): 33.75    Physical Exam Vitals and nursing note reviewed.  Constitutional:      Appearance: Normal appearance.  HENT:     Head: Normocephalic and atraumatic.     Nose: Nose normal.     Mouth/Throat:     Mouth: Mucous membranes are moist.     Pharynx: Oropharynx is clear.  Eyes:     Conjunctiva/sclera: Conjunctivae normal.     Pupils: Pupils are equal, round, and reactive to light.  Cardiovascular:     Rate and Rhythm: Normal rate and regular rhythm.     Pulses: Normal pulses.     Heart sounds: Normal heart sounds. No murmur heard. Pulmonary:     Effort: Pulmonary effort is normal.     Breath sounds: Normal breath sounds. No wheezing.  Abdominal:      General: Bowel sounds are normal.     Palpations: Abdomen is soft.     Tenderness: There is no abdominal tenderness. There is no right CVA tenderness or left CVA tenderness.  Musculoskeletal:        General: Normal range of motion.     Cervical back: Normal range of motion.     Right lower leg: No edema.     Left lower leg: No edema.  Skin:    General: Skin is warm and dry.  Neurological:     General: No focal deficit present.     Mental Status: She is alert and oriented to person, place, and time.  Psychiatric:        Mood and Affect: Mood normal.        Behavior: Behavior normal.      Results for orders placed or performed in visit on 04/17/24  POC CREATINE & ALBUMIN,URINE  Result Value Ref Range   Microalbumin Ur, POC 80 mg/L   Creatinine, POC 300 mg/dL   Albumin/Creatinine Ratio, Urine, POC 30-300   POCT CBG (Fasting - Glucose)  Result Value Ref Range   Glucose Fasting, POC 134 (A) 70 - 99 mg/dL    Recent Results (from the past 2160 hours)  POCT CBG (Fasting - Glucose)     Status: Abnormal   Collection Time: 02/14/24  2:33 PM  Result Value Ref Range   Glucose Fasting, POC 101 (A) 70 - 99 mg/dL  POCT CBG (Fasting - Glucose)     Status: Abnormal   Collection Time: 04/17/24  2:56 PM  Result Value Ref Range   Glucose Fasting, POC 134 (A) 70 - 99 mg/dL  POC CREATINE & ALBUMIN,URINE     Status: Abnormal   Collection Time: 04/17/24  3:23 PM  Result Value Ref Range   Microalbumin Ur, POC 80 mg/L   Creatinine, POC 300 mg/dL   Albumin/Creatinine Ratio, Urine, POC 30-300       Assessment & Plan:  Start Cymbalta 30 mg daily. Refilled Zomig 5 mg as needed. Increase Mounjaro  to 10 mg weekly injection. Continue taking other medications as prescribed. Keep upcoming Psychiatrist appointment. Check routine  blood work today and discuss results with patient. Work note given for todays appointment. Problem List Items Addressed This Visit     Essential hypertension, benign -  Primary   GAD (generalized anxiety disorder)   Relevant Medications   DULoxetine (CYMBALTA) 30 MG capsule   Mixed hyperlipidemia   Type 2 diabetes mellitus without complication, without long-term current use of insulin (HCC)   Relevant Medications   tirzepatide  (MOUNJARO ) 10 MG/0.5ML Pen   Other Relevant Orders   Hemoglobin A1c   POC CREATINE & ALBUMIN,URINE (Completed)   POCT CBG (Fasting - Glucose) (Completed)   Breast cancer screening by mammogram   Relevant Orders   MM 3D SCREENING MAMMOGRAM BILATERAL BREAST   Intractable migraine without aura and without status migrainosus   Relevant Medications   zolmitriptan (ZOMIG) 5 MG tablet   DULoxetine (CYMBALTA) 30 MG capsule   Class 1 obesity due to excess calories with serious comorbidity and body mass index (BMI) of 34.0 to 34.9 in adult   Relevant Medications   tirzepatide  (MOUNJARO ) 10 MG/0.5ML Pen    Return in about 2 weeks (around 05/01/2024).   Total time spent: 25 minutes. This time includes review of previous notes and results and patient face to face interaction during today's visit.    FERNAND FREDY RAMAN, MD  04/17/2024   This document may have been prepared by [facility] Voice Recognition software and as such may include unintentional dictation errors.

## 2024-05-02 ENCOUNTER — Ambulatory Visit: Payer: PRIVATE HEALTH INSURANCE | Admitting: Internal Medicine

## 2024-05-15 ENCOUNTER — Other Ambulatory Visit: Payer: Self-pay | Admitting: Internal Medicine

## 2024-05-15 DIAGNOSIS — G43019 Migraine without aura, intractable, without status migrainosus: Secondary | ICD-10-CM

## 2024-06-08 ENCOUNTER — Other Ambulatory Visit: Payer: Self-pay | Admitting: Internal Medicine

## 2024-06-08 DIAGNOSIS — F411 Generalized anxiety disorder: Secondary | ICD-10-CM

## 2024-06-14 ENCOUNTER — Other Ambulatory Visit: Payer: Self-pay | Admitting: Internal Medicine

## 2024-06-14 DIAGNOSIS — E119 Type 2 diabetes mellitus without complications: Secondary | ICD-10-CM

## 2024-06-16 ENCOUNTER — Other Ambulatory Visit: Payer: Self-pay | Admitting: Internal Medicine

## 2024-06-16 DIAGNOSIS — E119 Type 2 diabetes mellitus without complications: Secondary | ICD-10-CM

## 2024-06-17 ENCOUNTER — Other Ambulatory Visit: Payer: Self-pay | Admitting: Internal Medicine

## 2024-06-17 DIAGNOSIS — G43019 Migraine without aura, intractable, without status migrainosus: Secondary | ICD-10-CM

## 2024-06-23 ENCOUNTER — Other Ambulatory Visit: Payer: Self-pay | Admitting: Internal Medicine

## 2024-06-23 DIAGNOSIS — F411 Generalized anxiety disorder: Secondary | ICD-10-CM

## 2024-06-23 MED ORDER — DULOXETINE HCL 30 MG PO CPEP: 30.0000 mg | ORAL_CAPSULE | Freq: Every day | ORAL | 3 refills | Status: AC
